# Patient Record
Sex: Male | Born: 2000
Health system: Southern US, Community
[De-identification: ages and names within clinical notes are randomized; demographics above are authoritative.]

## PROBLEM LIST (undated history)

## (undated) HISTORY — PX: TONSILLECTOMY AND ADENOIDECTOMY: SUR1326

## (undated) HISTORY — PX: ADENOIDECTOMY: SUR15

## (undated) HISTORY — PX: TYMPANOSTOMY TUBE PLACEMENT: SHX32

---
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
## 2019-03-08 ENCOUNTER — Telehealth: Payer: Self-pay

## 2019-03-08 ENCOUNTER — Other Ambulatory Visit: Payer: Self-pay

## 2019-03-08 ENCOUNTER — Encounter: Payer: Self-pay | Admitting: Emergency Medicine

## 2019-03-08 ENCOUNTER — Ambulatory Visit (INDEPENDENT_AMBULATORY_CARE_PROVIDER_SITE_OTHER): Payer: BC Managed Care – PPO | Admitting: Emergency Medicine

## 2019-03-08 VITALS — BP 112/68 | HR 66 | Temp 97.9°F | Ht 70.05 in | Wt 175.0 lb

## 2019-03-08 DIAGNOSIS — Z020 Encounter for examination for admission to educational institution: Secondary | ICD-10-CM

## 2019-03-08 DIAGNOSIS — Z23 Encounter for immunization: Secondary | ICD-10-CM

## 2019-03-08 DIAGNOSIS — Z0000 Encounter for general adult medical examination without abnormal findings: Secondary | ICD-10-CM | POA: Diagnosis not present

## 2019-03-08 NOTE — Telephone Encounter (Signed)
Copy made of school forms, put in for scan

## 2019-03-08 NOTE — Progress Notes (Signed)
Thomas Steele 17 y.o.   Chief Complaint  Patient presents with  . Annual Exam    no medical concerns today, Mom wants to talk about the hpv    HISTORY OF PRESENT ILLNESS: This is a 18 y.o. male here today for college physical and vaccines.  HPI   Prior to Admission medications   Medication Sig Start Date End Date Taking? Authorizing Provider  omeprazole (PRILOSEC) 20 MG capsule Take by mouth. 06/15/18  Yes [provider]    No Known Allergies  Patient Active Problem List   Diagnosis Date Noted  . Congenital hydrocele 03/26/2012    History reviewed. No pertinent past medical history.  Past Surgical History:  Procedure Laterality Date  . ADENOIDECTOMY    . INGUINAL HERNIA REPAIR Right 05/23/2016   Procedure: RIGHT INGUINAL HERNIA REPAIR PEDIATRIC;  Surgeon: Leonia CoronaShuaib Farooqui, MD;  Location: [facility];  Service: Pediatrics;  Laterality: Right;  . TONSILLECTOMY AND ADENOIDECTOMY    . TYMPANOSTOMY TUBE PLACEMENT      Social History   Socioeconomic History  . Marital status: Single    Spouse name: Not on file  . Number of children: Not on file  . Years of education: Not on file  . Highest education level: Not on file  Occupational History  . Occupation: student    Comment: [employer]  Social Needs  . Financial resource strain: Not on file  . Food insecurity    Worry: Not on file    Inability: Not on file  . Transportation needs    Medical: Not on file    Non-medical: Not on file  Tobacco Use  . Smoking status: Never Smoker  . Smokeless tobacco: Never Used  Substance and Sexual Activity  . Alcohol use: No    Alcohol/week: 0.0 standard drinks  . Drug use: No  . Sexual activity: Not on file  Lifestyle  . Physical activity    Days per week: Not on file    Minutes per session: Not on file  . Stress: Not on file  Relationships  . Social Musicianconnections    Talks on phone: Not on file    Gets together: Not on file    Attends  religious service: Not on file    Active member of club or organization: Not on file    Attends meetings of clubs or organizations: Not on file    Relationship status: Not on file  . Intimate partner violence    Fear of current or ex partner: Not on file    Emotionally abused: Not on file    Physically abused: Not on file    Forced sexual activity: Not on file  Other Topics Concern  . Not on file  Social History Narrative   Lives with both parents and sister.    History reviewed. No pertinent family history.   Review of Systems  Constitutional: Negative for chills and fever.  HENT: Negative.  Negative for sore throat.   Eyes: Negative.   Respiratory: Negative.  Negative for cough, hemoptysis, shortness of breath and wheezing.   Cardiovascular: Negative.  Negative for chest pain and palpitations.  Gastrointestinal: Negative.   Genitourinary: Negative.   Musculoskeletal: Negative.   Skin: Negative.  Negative for rash.  Neurological: Negative.  Negative for dizziness and headaches.  All other systems reviewed and are negative.   Vitals:   03/08/19 1351  BP: 112/68  Pulse: 66  Temp: 97.9 F (36.6 C)  SpO2: 99%  Physical Exam Vitals signs reviewed.  Constitutional:      Appearance: Normal appearance.  HENT:     Head: Normocephalic and atraumatic.     Nose: Nose normal.     Mouth/Throat:     Mouth: Mucous membranes are moist.     Pharynx: Oropharynx is clear.  Eyes:     Extraocular Movements: Extraocular movements intact.     Conjunctiva/sclera: Conjunctivae normal.     Pupils: Pupils are equal, round, and reactive to light.  Neck:     Musculoskeletal: Normal range of motion and neck supple.  Cardiovascular:     Rate and Rhythm: Normal rate and regular rhythm.     Pulses: Normal pulses.     Heart sounds: Normal heart sounds.  Pulmonary:     Effort: Pulmonary effort is normal.     Breath sounds: Normal breath sounds.  Musculoskeletal: Normal range of motion.   Skin:    General: Skin is warm and dry.  Neurological:     General: No focal deficit present.     Mental Status: He is alert and oriented to person, place, and time.  Psychiatric:        Mood and Affect: Mood normal.        Behavior: Behavior normal.      ASSESSMENT & PLAN: [name] was seen today for annual exam.  Diagnoses and all orders for this visit:  Encounter for physical examination of student  Need for meningococcal vaccination -     MENINGOCOCCAL MCV4O  Need for HPV vaccination -     HPV 9-valent vaccine,Recombinat     Patient Instructions       If you have lab work done today you will be contacted with your lab results within the next 2 weeks.  If you have not heard from us then please contact us. The fastest way to get your results is to register for My Chart.   IF you received an x-ray today, you will receive an invoice from [facility]. Please contact [facility] at [phone] with questions or concerns regarding your invoice.   IF you received labwork today, you will receive an invoice from [facility]. Please contact [facility] at [phone] with questions or concerns regarding your invoice.   Our billing staff will not be able to assist you with questions regarding bills from these companies.  You will be contacted with the lab results as soon as they are available. The fastest way to get your results is to activate your My Chart account. Instructions are located on the last page of this paperwork. If you have not heard from us regarding the results in 2 weeks, please contact this office.     Health Maintenance, Male A healthy lifestyle and preventive care is important for your health and wellness. Ask your health care provider about what schedule of regular examinations is right for you. What should I know about weight and diet? Eat a Healthy Diet  Eat plenty of vegetables, fruits, whole grains, low-fat dairy products, and lean  protein.  Do not eat a lot of foods high in solid fats, added sugars, or salt.  Maintain a Healthy Weight Regular exercise can help you achieve or maintain a healthy weight. You should:  Do at least 150 minutes of exercise each week. The exercise should increase your heart rate and make you sweat (moderate-intensity exercise).  Do strength-training exercises at least twice a week. Watch Your Levels of Cholesterol and Blood Lipids  Have your blood  tested for lipids and cholesterol every 5 years starting at 19 years of age. If you are at high risk for heart disease, you should start having your blood tested when you are 19 years old. You may need to have your cholesterol levels checked more often if: ? Your lipid or cholesterol levels are high. ? You are older than 19 years of age. ? You are at high risk for heart disease. What should I know about cancer screening? Many types of cancers can be detected early and may often be prevented. Lung Cancer  You should be screened every year for lung cancer if: ? You are a current smoker who has smoked for at least 30 years. ? You are a former smoker who has quit within the past 15 years.  Talk to your health care provider about your screening options, when you should start screening, and how often you should be screened. Colorectal Cancer  Routine colorectal cancer screening usually begins at 19 years of age and should be repeated every 5-10 years until you are 19 years old. You may need to be screened more often if early forms of precancerous polyps or small growths are found. Your health care provider may recommend screening at an earlier age if you have risk factors for colon cancer.  Your health care provider may recommend using home test kits to check for hidden blood in the stool.  A small camera at the end of a tube can be used to examine your colon (sigmoidoscopy or colonoscopy). This checks for the earliest forms of colorectal cancer.  Prostate and Testicular Cancer  Depending on your age and overall health, your health care provider may do certain tests to screen for prostate and testicular cancer.  Talk to your health care provider about any symptoms or concerns you have about testicular or prostate cancer. Skin Cancer  Check your skin from head to toe regularly.  Tell your health care provider about any new moles or changes in moles, especially if: ? There is a change in a mole's size, shape, or color. ? You have a mole that is larger than a pencil eraser.  Always use sunscreen. Apply sunscreen liberally and repeat throughout the day.  Protect yourself by wearing long sleeves, pants, a wide-brimmed hat, and sunglasses when outside. What should I know about heart disease, diabetes, and high blood pressure?  If you are 4518-19 years of age, have your blood pressure checked every 3-5 years. If you are 19 years of age or older, have your blood pressure checked every year. You should have your blood pressure measured twice-once when you are at a hospital or clinic, and once when you are not at a hospital or clinic. Record the average of the two measurements. To check your blood pressure when you are not at a hospital or clinic, you can use: ? An automated blood pressure machine at a pharmacy. ? A home blood pressure monitor.  Talk to your health care provider about your target blood pressure.  If you are between 2545-19 years old, ask your health care provider if you should take aspirin to prevent heart disease.  Have regular diabetes screenings by checking your fasting blood sugar level. ? If you are at a normal weight and have a low risk for diabetes, have this test once every three years after the age of 745. ? If you are overweight and have a high risk for diabetes, consider being tested at a younger age or more  often.  A one-time screening for abdominal aortic aneurysm (AAA) by ultrasound is recommended for men aged  13-75 years who are current or former smokers. What should I know about preventing infection? Hepatitis B If you have a higher risk for hepatitis B, you should be screened for this virus. Talk with your health care provider to find out if you are at risk for hepatitis B infection. Hepatitis C Blood testing is recommended for:  Everyone born from [birth year] through 1966.  Anyone with known risk factors for hepatitis C. Sexually Transmitted Diseases (STDs)  You should be screened each year for STDs including gonorrhea and chlamydia if: ? You are sexually active and are younger than 19 years of age. ? You are older than 19 years of age and your health care provider tells you that you are at risk for this type of infection. ? Your sexual activity has changed since you were last screened and you are at an increased risk for chlamydia or gonorrhea. Ask your health care provider if you are at risk.  Talk with your health care provider about whether you are at high risk of being infected with HIV. Your health care provider may recommend a prescription medicine to help prevent HIV infection. What else can I do?  Schedule regular health, dental, and eye exams.  Stay current with your vaccines (immunizations).  Do not use any tobacco products, such as cigarettes, chewing tobacco, and e-cigarettes. If you need help quitting, ask your health care provider.  Limit alcohol intake to no more than 2 drinks per day. One drink equals 12 ounces of beer, 5 ounces of wine, or 1 ounces of hard liquor.  Do not use street drugs.  Do not share needles.  Ask your health care provider for help if you need support or information about quitting drugs.  Tell your health care provider if you often feel depressed.  Tell your health care provider if you have ever been abused or do not feel safe at home. This information is not intended to replace advice given to you by your health care provider. Make sure you discuss  any questions you have with your health care provider. Document Released: 02/29/2008 Document Revised: 05/01/2016 Document Reviewed: 06/06/2015 Elsevier Interactive Patient Education  2019 Elsevier Inc.     Agustina Caroli, MD [facility]

## 2019-03-08 NOTE — Patient Instructions (Addendum)

## 2019-03-10 DIAGNOSIS — U071 COVID-19: Secondary | ICD-10-CM | POA: Diagnosis not present

## 2019-04-22 ENCOUNTER — Telehealth: Payer: Self-pay | Admitting: Emergency Medicine

## 2019-04-22 NOTE — Telephone Encounter (Signed)
Patient mother called and needs a copy of his physical that was done in June , he decided to go to the local college instead and they need a copy if it can be sent through his mychart  And emailed it would be great   [email or web address]

## 2019-04-25 ENCOUNTER — Encounter: Payer: Self-pay | Admitting: Emergency Medicine

## 2019-04-30 ENCOUNTER — Telehealth: Payer: Self-pay | Admitting: Emergency Medicine

## 2019-04-30 NOTE — Telephone Encounter (Signed)
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 So he needs the form filled out again. The paper is in the providers box at nurses station.

## 2019-05-01 NOTE — Telephone Encounter (Signed)
Thanks

## 2019-05-03 NOTE — Telephone Encounter (Signed)
New forms received and given to the provider to be completed

## 2019-05-03 NOTE — Telephone Encounter (Signed)
Please fax paperwork to [phone] to attention Rolena Infante (mom)

## 2019-05-07 NOTE — Telephone Encounter (Signed)
I have faxed the form on Monday and re-faxed today as well. I called the pt mom and informed her of this and she mom stated that she did get the message on Monday. I stated understanding and forms are placed in the scan bin.  Thanks, [employer]

## 2019-05-20 ENCOUNTER — Telehealth: Payer: BC Managed Care – PPO | Admitting: Emergency Medicine

## 2019-05-20 ENCOUNTER — Other Ambulatory Visit: Payer: Self-pay

## 2019-05-27 ENCOUNTER — Telehealth: Payer: BC Managed Care – PPO | Admitting: Emergency Medicine

## 2019-08-16 ENCOUNTER — Telehealth: Payer: BC Managed Care – PPO | Admitting: Emergency Medicine

## 2019-08-16 ENCOUNTER — Other Ambulatory Visit: Payer: Self-pay

## 2019-08-16 DIAGNOSIS — S134XXA Sprain of ligaments of cervical spine, initial encounter: Secondary | ICD-10-CM | POA: Diagnosis not present

## 2019-08-16 DIAGNOSIS — S73101A Unspecified sprain of right hip, initial encounter: Secondary | ICD-10-CM | POA: Diagnosis not present

## 2019-08-16 DIAGNOSIS — S233XXA Sprain of ligaments of thoracic spine, initial encounter: Secondary | ICD-10-CM | POA: Diagnosis not present

## 2019-08-16 DIAGNOSIS — S338XXA Sprain of other parts of lumbar spine and pelvis, initial encounter: Secondary | ICD-10-CM | POA: Diagnosis not present

## 2019-08-17 ENCOUNTER — Other Ambulatory Visit: Payer: Self-pay

## 2019-08-17 DIAGNOSIS — Z20822 Contact with and (suspected) exposure to covid-19: Secondary | ICD-10-CM

## 2019-08-19 ENCOUNTER — Ambulatory Visit: Payer: BC Managed Care – PPO | Admitting: Emergency Medicine

## 2019-08-20 LAB — NOVEL CORONAVIRUS, NAA: SARS-CoV-2, NAA: NOT DETECTED

## 2019-08-23 ENCOUNTER — Telehealth (INDEPENDENT_AMBULATORY_CARE_PROVIDER_SITE_OTHER): Payer: BC Managed Care – PPO | Admitting: Emergency Medicine

## 2019-08-23 ENCOUNTER — Encounter: Payer: Self-pay | Admitting: Emergency Medicine

## 2019-08-23 ENCOUNTER — Other Ambulatory Visit: Payer: Self-pay

## 2019-08-23 VITALS — Ht 70.0 in | Wt 182.0 lb

## 2019-08-23 DIAGNOSIS — J302 Other seasonal allergic rhinitis: Secondary | ICD-10-CM

## 2019-08-23 MED ORDER — MONTELUKAST SODIUM 10 MG PO TABS: 10.0000 mg | ORAL_TABLET | Freq: Every day | ORAL | 1 refills | Status: DC

## 2019-08-23 NOTE — Progress Notes (Signed)
Telemedicine Encounter- SOAP NOTE Established Patient  This telephone encounter was conducted with the patient's (or proxy's) verbal consent via audio telecommunications: yes/no: Yes Patient was instructed to have this encounter in a suitably private space; and to only have persons present to whom they give permission to participate. In addition, patient identity was confirmed by use of name plus two identifiers (DOB and address).  I discussed the limitations, risks, security and privacy concerns of performing an evaluation and management service by telephone and the availability of in person appointments. I also discussed with the patient that there may be a patient responsible charge related to this service. The patient expressed understanding and agreed to proceed.  I spent a total of TIME; 0 MIN TO 60 MIN: 15 minutes talking with the patient or their proxy.  Chief Complaint  Patient presents with  . Headache    x 4 weeks ago - on and off, last Wednesday COVID test and results negative  . Sore Throat    with random cough - dry, and body ache    Subjective   Thomas Steele is a 18 y.o. male established patient. Telephone visit today complaining of intermittent symptoms for the past several months.  Occasional headaches with random sweats, achiness, intermittent sore throat.  Has a history of chronic allergies but no evaluation by an allergist yet.  Has been taking Allegra with some success.  Symptoms come and go, seasonal.  Today feels much better than 3 days ago, almost asymptomatic.  Spoke to mother also who provided more useful information.  Thomas Steele has been struggling with allergies for the past couple years.  Tested negative for COVID-19 last week.  No other significant symptoms.  HPI   Patient Active Problem List   Diagnosis Date Noted  . Congenital hydrocele 03/26/2012    History reviewed. No pertinent past medical history.  Current Outpatient Medications  Medication Sig  Dispense Refill  . fexofenadine (ALLEGRA) 180 MG tablet Take 180 mg by mouth daily.    [name] omeprazole (PRILOSEC) 20 MG capsule Take by mouth.     No current facility-administered medications for this visit.     No Known Allergies  Social History   Socioeconomic History  . Marital status: Single    Spouse name: Not on file  . Number of children: Not on file  . Years of education: Not on file  . Highest education level: Not on file  Occupational History  . Occupation: student    Comment: [employer]  Social Needs  . Financial resource strain: Not on file  . Food insecurity    Worry: Not on file    Inability: Not on file  . Transportation needs    Medical: Not on file    Non-medical: Not on file  Tobacco Use  . Smoking status: Never Smoker  . Smokeless tobacco: Never Used  Substance and Sexual Activity  . Alcohol use: No    Alcohol/week: 0.0 standard drinks  . Drug use: No  . Sexual activity: Not on file  Lifestyle  . Physical activity    Days per week: Not on file    Minutes per session: Not on file  . Stress: Not on file  Relationships  . Social Musician on phone: Not on file    Gets together: Not on file    Attends religious service: Not on file    Active member of club or organization: Not on file  Attends meetings of clubs or organizations: Not on file    Relationship status: Not on file  . Intimate partner violence    Fear of current or ex partner: Not on file    Emotionally abused: Not on file    Physically abused: Not on file    Forced sexual activity: Not on file  Other Topics Concern  . Not on file  Social History Narrative   Lives with both parents and sister.    Review of Systems  Constitutional: Negative.  Negative for chills and fever.  HENT: Positive for congestion and sore throat.   Eyes: Negative.  Negative for blurred vision and double vision.  Respiratory: Negative for cough and shortness of breath.   Cardiovascular:  Negative.  Negative for chest pain and palpitations.  Gastrointestinal: Negative.  Negative for abdominal pain, diarrhea, nausea and vomiting.  Genitourinary: Negative.  Negative for dysuria.  Musculoskeletal: Positive for myalgias.  Skin: Negative.   Neurological: Positive for headaches.  All other systems reviewed and are negative.   Objective  Alert and oriented x3 in no apparent respiratory distress. Vitals as reported by the patient: Today's Vitals   08/23/19 1442  Weight: 182 lb (82.6 kg)  Height: 5\' 10"  (1.778 m)    There are no diagnoses linked to this encounter. [name] was seen today for headache and sore throat.  Diagnoses and all orders for this visit:  Seasonal allergies -     montelukast (SINGULAIR) 10 MG tablet; Take 1 tablet (10 mg total) by mouth at bedtime. -     Ambulatory referral to Allergy     I discussed the assessment and treatment plan with the patient. The patient was provided an opportunity to ask questions and all were answered. The patient agreed with the plan and demonstrated an understanding of the instructions.   The patient was advised to call back or seek an in-person evaluation if the symptoms worsen or if the condition fails to improve as anticipated.  I provided 15 minutes of non-face-to-face time during this encounter.  Horald Pollen, MD  Primary Care at [facility]

## 2019-08-30 DIAGNOSIS — S134XXA Sprain of ligaments of cervical spine, initial encounter: Secondary | ICD-10-CM | POA: Diagnosis not present

## 2019-08-30 DIAGNOSIS — S73101A Unspecified sprain of right hip, initial encounter: Secondary | ICD-10-CM | POA: Diagnosis not present

## 2019-08-30 DIAGNOSIS — S338XXA Sprain of other parts of lumbar spine and pelvis, initial encounter: Secondary | ICD-10-CM | POA: Diagnosis not present

## 2019-08-30 DIAGNOSIS — S233XXA Sprain of ligaments of thoracic spine, initial encounter: Secondary | ICD-10-CM | POA: Diagnosis not present

## 2019-09-02 ENCOUNTER — Ambulatory Visit: Payer: BC Managed Care – PPO | Admitting: Allergy

## 2019-09-06 DIAGNOSIS — S73101A Unspecified sprain of right hip, initial encounter: Secondary | ICD-10-CM | POA: Diagnosis not present

## 2019-09-06 DIAGNOSIS — S338XXA Sprain of other parts of lumbar spine and pelvis, initial encounter: Secondary | ICD-10-CM | POA: Diagnosis not present

## 2019-09-06 DIAGNOSIS — S233XXA Sprain of ligaments of thoracic spine, initial encounter: Secondary | ICD-10-CM | POA: Diagnosis not present

## 2019-09-06 DIAGNOSIS — S134XXA Sprain of ligaments of cervical spine, initial encounter: Secondary | ICD-10-CM | POA: Diagnosis not present

## 2019-09-30 ENCOUNTER — Ambulatory Visit: Payer: BC Managed Care – PPO | Admitting: Allergy

## 2019-10-14 ENCOUNTER — Ambulatory Visit: Payer: Self-pay | Admitting: Allergy

## 2020-06-10 ENCOUNTER — Other Ambulatory Visit: Payer: Self-pay | Admitting: Emergency Medicine

## 2020-06-10 DIAGNOSIS — J302 Other seasonal allergic rhinitis: Secondary | ICD-10-CM

## 2020-06-16 ENCOUNTER — Encounter: Payer: Self-pay | Admitting: Family Medicine

## 2020-06-16 ENCOUNTER — Ambulatory Visit (INDEPENDENT_AMBULATORY_CARE_PROVIDER_SITE_OTHER): Payer: BC Managed Care – PPO | Admitting: Family Medicine

## 2020-06-16 ENCOUNTER — Ambulatory Visit (INDEPENDENT_AMBULATORY_CARE_PROVIDER_SITE_OTHER): Payer: BC Managed Care – PPO

## 2020-06-16 ENCOUNTER — Other Ambulatory Visit: Payer: Self-pay

## 2020-06-16 VITALS — BP 138/74 | HR 64 | Temp 98.1°F | Ht 70.0 in | Wt 180.4 lb

## 2020-06-16 DIAGNOSIS — S67193A Crushing injury of left middle finger, initial encounter: Secondary | ICD-10-CM

## 2020-06-16 DIAGNOSIS — M79645 Pain in left finger(s): Secondary | ICD-10-CM

## 2020-06-16 DIAGNOSIS — M7989 Other specified soft tissue disorders: Secondary | ICD-10-CM | POA: Diagnosis not present

## 2020-06-16 DIAGNOSIS — T148XXA Other injury of unspecified body region, initial encounter: Secondary | ICD-10-CM

## 2020-06-16 NOTE — Patient Instructions (Signed)
° ° ° °  If you have lab work done today you will be contacted with your lab results within the next 2 weeks.  If you have not heard from us then please contact us. The fastest way to get your results is to register for My Chart. ° ° °IF you received an x-ray today, you will receive an invoice from [facility]. Please contact [facility] at [phone] with questions or concerns regarding your invoice.  ° °IF you received labwork today, you will receive an invoice from [facility]. Please contact [facility] at [phone] with questions or concerns regarding your invoice.  ° °Our billing staff will not be able to assist you with questions regarding bills from these companies. ° °You will be contacted with the lab results as soon as they are available. The fastest way to get your results is to activate your My Chart account. Instructions are located on the last page of this paperwork. If you have not heard from us regarding the results in 2 weeks, please contact this office. °  ° ° ° °

## 2020-06-16 NOTE — Progress Notes (Signed)
10/1/20219:53 AM  Thomas Steele July 23, 2001, 19 y.o., male [id number]  Chief Complaint  Patient presents with  . left middle finger injury    crushed by 2 weights on last friday 06/09/20    HPI:   Patient is a 19 y.o. male who presents today for left middle finger ingury  A week ago had crush injury with weights He self drained subungal hematoma the day after, since then throbbing pain resolved He however has had swelling and redness of nail bed No fever or chills No numbness or tingling He is able to move his finger fine He does not remember his last tetanus vaccine - declines  Depression screen [facility] 2/9 06/16/2020 08/23/2019 05/22/2016  Decreased Interest 0 0 0  Down, Depressed, Hopeless 0 0 0  PHQ - 2 Score 0 0 0    Fall Risk  06/16/2020 08/23/2019  Falls in the past year? 0 0  Number falls in past yr: 0 -  Injury with Fall? 0 -  Follow up Falls evaluation completed Falls evaluation completed     No Known Allergies  Prior to Admission medications   Medication Sig Start Date End Date Taking? Authorizing Provider  fexofenadine (ALLEGRA) 180 MG tablet Take 180 mg by mouth daily.   Yes [provider]  montelukast (SINGULAIR) 10 MG tablet TAKE 1 TABLET BY MOUTH EVERYDAY AT BEDTIME 06/10/20  Yes Sagardia, Eilleen Kempf, MD  omeprazole (PRILOSEC) 20 MG capsule Take by mouth. 06/15/18  Yes [provider]    No past medical history on file.  Past Surgical History:  Procedure Laterality Date  . ADENOIDECTOMY    . INGUINAL HERNIA REPAIR Right 05/23/2016   Procedure: RIGHT INGUINAL HERNIA REPAIR PEDIATRIC;  Surgeon: Leonia Corona, MD;  Location: [facility];  Service: Pediatrics;  Laterality: Right;  . TONSILLECTOMY AND ADENOIDECTOMY    . TYMPANOSTOMY TUBE PLACEMENT      Social History   Tobacco Use  . Smoking status: Never Smoker  . Smokeless tobacco: Never Used  Substance Use Topics  . Alcohol use: No    Alcohol/week: 0.0 standard  drinks    No family history on file.  ROS Per hpi  OBJECTIVE:  Today's Vitals   06/16/20 0945  BP: 138/74  Pulse: 64  Temp: 98.1 F (36.7 C)  TempSrc: Temporal  SpO2: 98%  Weight: 180 lb 6.4 oz (81.8 kg)  Height: 5\' 10"  (1.778 m)   Body mass index is 25.88 kg/m.   Physical Exam   Gen: AAOx3, NAD Left middle finger: distally swollen, subungal hematoma, mild erythema and swelling along intact cuticle. No warmth or tenderness, no drainage, finger pad not tender or swollen. NVI.  No results found for this or any previous visit (from the past 24 hour(s)).  DG Hand Complete Left  Result Date: 06/16/2020 CLINICAL DATA:  Crush injury to middle finger 1 week ago EXAM: LEFT HAND - COMPLETE 3+ VIEW COMPARISON:  None FINDINGS: Osseous mineralization normal. Joint spaces preserved. Soft tissue swelling at distal phalanx LEFT middle finger dorsally. No acute fracture, dislocation, or bone destruction. IMPRESSION: No acute osseous abnormalities. Electronically Signed   By: 08/16/2020 M.D.   On: 06/16/2020 10:09     ASSESSMENT and PLAN  1. Swollen finger No signs of infection. No fracture on xray. Patient declined draining and tetanus vaccine. Supportive measures and RTC precautions reviewed. - DG Hand Complete Left  2. Crush injury  No follow-ups on file.    08/16/2020  Pamella Pert, MD Primary Care at [facility] [place], [place] [address] Ph.  [phone] Fax [phone]

## 2020-06-22 DIAGNOSIS — T148XXA Other injury of unspecified body region, initial encounter: Secondary | ICD-10-CM | POA: Diagnosis not present

## 2020-08-08 ENCOUNTER — Other Ambulatory Visit: Payer: BC Managed Care – PPO

## 2020-08-08 ENCOUNTER — Other Ambulatory Visit: Payer: Self-pay

## 2020-08-08 DIAGNOSIS — Z20822 Contact with and (suspected) exposure to covid-19: Secondary | ICD-10-CM | POA: Diagnosis not present

## 2020-08-09 LAB — NOVEL CORONAVIRUS, NAA: SARS-CoV-2, NAA: NOT DETECTED

## 2020-08-09 LAB — SARS-COV-2, NAA 2 DAY TAT

## 2020-08-31 DIAGNOSIS — M25521 Pain in right elbow: Secondary | ICD-10-CM | POA: Diagnosis not present

## 2020-08-31 DIAGNOSIS — M25511 Pain in right shoulder: Secondary | ICD-10-CM | POA: Diagnosis not present

## 2020-09-27 DIAGNOSIS — Z20822 Contact with and (suspected) exposure to covid-19: Secondary | ICD-10-CM | POA: Diagnosis not present

## 2020-10-01 ENCOUNTER — Other Ambulatory Visit: Payer: Self-pay | Admitting: Emergency Medicine

## 2020-10-01 DIAGNOSIS — J302 Other seasonal allergic rhinitis: Secondary | ICD-10-CM

## 2021-01-07 ENCOUNTER — Other Ambulatory Visit: Payer: Self-pay | Admitting: Emergency Medicine

## 2021-01-07 DIAGNOSIS — J302 Other seasonal allergic rhinitis: Secondary | ICD-10-CM

## 2021-03-27 ENCOUNTER — Other Ambulatory Visit: Payer: Self-pay | Admitting: Emergency Medicine

## 2021-03-27 DIAGNOSIS — J302 Other seasonal allergic rhinitis: Secondary | ICD-10-CM

## 2021-05-09 DIAGNOSIS — Z130 Encounter for screening for diseases of the blood and blood-forming organs and certain disorders involving the immune mechanism: Secondary | ICD-10-CM | POA: Diagnosis not present

## 2021-07-23 ENCOUNTER — Other Ambulatory Visit: Payer: Self-pay | Admitting: Emergency Medicine

## 2021-07-23 DIAGNOSIS — J302 Other seasonal allergic rhinitis: Secondary | ICD-10-CM

## 2021-11-14 ENCOUNTER — Ambulatory Visit: Payer: BC Managed Care – PPO | Admitting: Emergency Medicine

## 2021-11-21 ENCOUNTER — Other Ambulatory Visit: Payer: Self-pay

## 2021-11-21 ENCOUNTER — Encounter: Payer: Self-pay | Admitting: Emergency Medicine

## 2021-11-21 ENCOUNTER — Ambulatory Visit: Payer: BC Managed Care – PPO | Admitting: Emergency Medicine

## 2021-11-21 VITALS — BP 118/70 | HR 56 | Ht 70.0 in | Wt 185.0 lb

## 2021-11-21 DIAGNOSIS — B349 Viral infection, unspecified: Secondary | ICD-10-CM | POA: Insufficient documentation

## 2021-11-21 DIAGNOSIS — G9331 Postviral fatigue syndrome: Secondary | ICD-10-CM | POA: Insufficient documentation

## 2021-11-21 DIAGNOSIS — J302 Other seasonal allergic rhinitis: Secondary | ICD-10-CM

## 2021-11-21 LAB — COMPREHENSIVE METABOLIC PANEL
ALT: 39 U/L (ref 0–53)
AST: 26 U/L (ref 0–37)
Albumin: 4.5 g/dL (ref 3.5–5.2)
Alkaline Phosphatase: 82 U/L (ref 39–117)
BUN: 28 mg/dL — ABNORMAL HIGH (ref 6–23)
CO2: 25 mEq/L (ref 19–32)
Calcium: 9.4 mg/dL (ref 8.4–10.5)
Chloride: 104 mEq/L (ref 96–112)
Creatinine, Ser: 1.05 mg/dL (ref 0.40–1.50)
GFR: 101.41 mL/min (ref >60.00)
Glucose, Bld: 68 mg/dL — ABNORMAL LOW (ref 70–99)
Potassium: 4.2 mEq/L (ref 3.5–5.1)
Sodium: 141 mEq/L (ref 135–145)
Total Bilirubin: 0.5 mg/dL (ref 0.2–1.2)
Total Protein: 6.8 g/dL (ref 6.0–8.3)

## 2021-11-21 LAB — CBC WITH DIFFERENTIAL/PLATELET
Basophils Absolute: 0 10*3/uL (ref 0.0–0.1)
Basophils Relative: 0.4 % (ref 0.0–3.0)
Eosinophils Absolute: 0.1 10*3/uL (ref 0.0–0.7)
Eosinophils Relative: 1.3 % (ref 0.0–5.0)
HCT: 46.7 % (ref 39.0–52.0)
Hemoglobin: 15.7 g/dL (ref 13.0–17.0)
Lymphocytes Relative: 36.5 % (ref 12.0–46.0)
Lymphs Abs: 2 10*3/uL (ref 0.7–4.0)
MCHC: 33.6 g/dL (ref 30.0–36.0)
MCV: 90.3 fl (ref 78.0–100.0)
Monocytes Absolute: 0.4 10*3/uL (ref 0.1–1.0)
Monocytes Relative: 7.6 % (ref 3.0–12.0)
Neutro Abs: 3 10*3/uL (ref 1.4–7.7)
Neutrophils Relative %: 54.2 % (ref 43.0–77.0)
Platelets: 223 10*3/uL (ref 150.0–400.0)
RBC: 5.17 Mil/uL (ref 4.22–5.81)
RDW: 12.6 % (ref 11.5–15.5)
WBC: 5.5 10*3/uL (ref 4.0–10.5)

## 2021-11-21 LAB — TSH: TSH: 0.56 u[IU]/mL (ref 0.35–5.50)

## 2021-11-21 LAB — HEMOGLOBIN A1C: Hgb A1c MFr Bld: 5.1 % (ref 4.6–6.5)

## 2021-11-21 MED ORDER — MONTELUKAST SODIUM 10 MG PO TABS: ORAL_TABLET | ORAL | 3 refills | Status: DC

## 2021-11-21 NOTE — Progress Notes (Signed)
Thomas Steele 21 y.o.   Chief Complaint  Patient presents with   Medication Refill    Singular, discuss fatigue    HISTORY OF PRESENT ILLNESS: Acute problem visit today. This is a 21 y.o. male complaining of fatigue for the past couple weeks. Electronics engineer.  Baseball player.  Teammates also sick with similar symptoms for the past 10 to 12 days with cough and flulike symptoms.  Patient and teammates tested negative for COVID. Has cough at times productive of phlegm.  Headaches and general malaise. No other associated symptoms. Has history of seasonal allergies.  Needs a refill on Singulair. No other complaints or medical concerns today.  Medication Refill Associated symptoms include coughing. Pertinent negatives include no abdominal pain, chest pain, chills, congestion, fever, headaches, nausea, rash, sore throat or vomiting.    Prior to Admission medications   Medication Sig Start Date End Date Taking? Authorizing Provider  fexofenadine (ALLEGRA) 180 MG tablet Take 180 mg by mouth daily.   Yes [provider]  montelukast (SINGULAIR) 10 MG tablet TAKE 1 TABLET BY MOUTH EVERYDAY AT BEDTIME 11/21/21   Horald Pollen, MD    No Known Allergies  Patient Active Problem List   Diagnosis Date Noted   Viral illness 11/21/2021   Postviral fatigue syndrome 11/21/2021   Congenital hydrocele 03/26/2012    No past medical history on file.  Past Surgical History:  Procedure Laterality Date   ADENOIDECTOMY     INGUINAL HERNIA REPAIR Right 05/23/2016   Procedure: RIGHT INGUINAL HERNIA REPAIR PEDIATRIC;  Surgeon: Gerald Stabs, MD;  Location: [facility];  Service: Pediatrics;  Laterality: Right;   TONSILLECTOMY AND ADENOIDECTOMY     TYMPANOSTOMY TUBE PLACEMENT      Social History   Socioeconomic History   Marital status: Single    Spouse name: Not on file   Number of children: Not on file   Years of education: Not on file   Highest education  level: Not on file  Occupational History   Occupation: student    Comment: [employer]  Tobacco Use   Smoking status: Never   Smokeless tobacco: Never  Substance and Sexual Activity   Alcohol use: No    Alcohol/week: 0.0 standard drinks   Drug use: No   Sexual activity: Not on file  Other Topics Concern   Not on file  Social History Narrative   Lives with both parents and sister.   Social Determinants of Health   Financial Resource Strain: Not on file  Food Insecurity: Not on file  Transportation Needs: Not on file  Physical Activity: Not on file  Stress: Not on file  Social Connections: Not on file  Intimate Partner Violence: Not on file    No family history on file.   Review of Systems  Constitutional:  Positive for malaise/fatigue. Negative for chills and fever.  HENT:  Negative for congestion and sore throat.   Respiratory:  Positive for cough. Negative for shortness of breath.   Cardiovascular: Negative.  Negative for chest pain and palpitations.  Gastrointestinal: Negative.  Negative for abdominal pain, diarrhea, nausea and vomiting.  Genitourinary: Negative.  Negative for dysuria and hematuria.  Skin: Negative.  Negative for rash.  Neurological:  Negative for dizziness and headaches.  All other systems reviewed and are negative.  Today's Vitals   11/21/21 0929  BP: 118/70  Pulse: (!) 56  SpO2: 97%  Weight: 185 lb (83.9 kg)  Height: '5\' 10"'  (1.778 m)  Body mass index is 26.54 kg/m.  Physical Exam Vitals reviewed.  Constitutional:      Appearance: Normal appearance.  HENT:     Head: Normocephalic.     Right Ear: Tympanic membrane, ear canal and external ear normal.     Left Ear: Tympanic membrane, ear canal and external ear normal.     Mouth/Throat:     Mouth: Mucous membranes are moist.     Pharynx: Oropharynx is clear.  Eyes:     Extraocular Movements: Extraocular movements intact.     Conjunctiva/sclera: Conjunctivae normal.     Pupils:  Pupils are equal, round, and reactive to light.  Cardiovascular:     Rate and Rhythm: Normal rate and regular rhythm.     Pulses: Normal pulses.     Heart sounds: Normal heart sounds.  Pulmonary:     Effort: Pulmonary effort is normal.     Breath sounds: Normal breath sounds.  Abdominal:     General: There is no distension.     Palpations: Abdomen is soft. There is no mass.     Tenderness: There is no abdominal tenderness.  Musculoskeletal:        General: Normal range of motion.     Right lower leg: No edema.     Left lower leg: No edema.  Skin:    General: Skin is warm and dry.     Capillary Refill: Capillary refill takes less than 2 seconds.  Neurological:     General: No focal deficit present.     Mental Status: He is alert and oriented to person, place, and time.  Psychiatric:        Mood and Affect: Mood normal.        Behavior: Behavior normal.     ASSESSMENT & PLAN: Problem List Items Addressed This Visit       Other   Viral illness    Stable and running its course.  No red flag signs or symptoms.  COVID-negative. Advised to rest and stay well-hydrated. Follow-up if no better or worse during the next couple of weeks.      Postviral fatigue syndrome - Primary    Fatigue secondary to viral syndrome.  Blood work done today to rule out other causes. Differential diagnosis of fatigue discussed with patient. Advised to rest, eat well and take in appropriate calories, stay well-hydrated.      Relevant Orders   CBC with Differential/Platelet   Comprehensive metabolic panel   TSH   Hemoglobin A1c   Mononucleosis Test, Qual W/ Reflex   Other Visit Diagnoses     Seasonal allergies  (Chronic)      Relevant Medications   montelukast (SINGULAIR) 10 MG tablet      Patient Instructions  Fatigue If you have fatigue, you feel tired all the time and have a lack of energy or a lack of motivation. Fatigue may make it difficult to start or complete tasks because of  exhaustion. In general, occasional or mild fatigue is often a normal response to activity or life. However, long-lasting (chronic) or extreme fatigue may be a symptom of a medical condition. Follow these instructions at home: General instructions Watch your fatigue for any changes. Go to bed and get up at the same time every day. Avoid fatigue by pacing yourself during the day and getting enough sleep at night. Maintain a healthy weight. Medicines Take over-the-counter and prescription medicines only as told by your health care provider. Take a multivitamin, if told  by your health care provider.  Do not use herbal or dietary supplements unless they are approved by your health care provider. Activity  Exercise regularly, as told by your health care provider. Use or practice techniques to help you relax, such as yoga, tai chi, meditation, or massage therapy. Eating and drinking  Avoid heavy meals in the evening. Eat a well-balanced diet, which includes lean proteins, whole grains, plenty of fruits and vegetables, and low-fat dairy products. Avoid consuming too much caffeine. Avoid the use of alcohol. Drink enough fluid to keep your urine pale yellow. Lifestyle Change situations that cause you stress. Try to keep your work and personal schedule in balance. Do not use any products that contain nicotine or tobacco, such as cigarettes and e-cigarettes. If you need help quitting, ask your health care provider. Do not use drugs. Contact a health care provider if: Your fatigue does not get better. You have a fever. You suddenly lose or gain weight. You have headaches. You have trouble falling asleep or sleeping through the night. You feel angry, guilty, anxious, or sad. You are unable to have a bowel movement (constipation). Your skin is dry. You have swelling in your legs or another part of your body. Get help right away if: You feel confused. Your vision is blurry. You feel faint or  you pass out. You have a severe headache. You have severe pain in your abdomen, your back, or the area between your waist and hips (pelvis). You have chest pain, shortness of breath, or an irregular or fast heartbeat. You are unable to urinate, or you urinate less than normal. You have abnormal bleeding, such as bleeding from the rectum, vagina, nose, lungs, or nipples. You vomit blood. You have thoughts about hurting yourself or others. If you ever feel like you may hurt yourself or others, or have thoughts about taking your own life, get help right away. You can go to your nearest emergency department or call: Your local emergency services (911 in the [place] A suicide crisis helpline, such as the [facility] at 972-693-2791 or 988 in the [place]. This is open 24 hours a day. Summary If you have fatigue, you feel tired all the time and have a lack of energy or a lack of motivation. Fatigue may make it difficult to start or complete tasks because of exhaustion. Long-lasting (chronic) or extreme fatigue may be a symptom of a medical condition. Exercise regularly, as told by your health care provider. Change situations that cause you stress. Try to keep your work and personal schedule in balance. This information is not intended to replace advice given to you by your health care provider. Make sure you discuss any questions you have with your health care provider. Document Revised: 03/28/2021 Document Reviewed: 07/13/2020 Elsevier Patient Education  2022 [place], MD [facility]

## 2021-11-21 NOTE — Assessment & Plan Note (Signed)
Fatigue secondary to viral syndrome.  Blood work done today to rule out other causes. ?Differential diagnosis of fatigue discussed with patient. ?Advised to rest, eat well and take in appropriate calories, stay well-hydrated. ?

## 2021-11-21 NOTE — Patient Instructions (Signed)
Fatigue ?If you have fatigue, you feel tired all the time and have a lack of energy or a lack of motivation. Fatigue may make it difficult to start or complete tasks because of exhaustion. In general, occasional or mild fatigue is often a normal response to activity or life. However, long-lasting (chronic) or extreme fatigue may be a symptom of a medical condition. ?Follow these instructions at home: ?General instructions ?Watch your fatigue for any changes. ?Go to bed and get up at the same time every day. ?Avoid fatigue by pacing yourself during the day and getting enough sleep at night. ?Maintain a healthy weight. ?Medicines ?Take over-the-counter and prescription medicines only as told by your health care provider. ?Take a multivitamin, if told by your health care provider.  ?Do not use herbal or dietary supplements unless they are approved by your health care provider. ?Activity ? ?Exercise regularly, as told by your health care provider. ?Use or practice techniques to help you relax, such as yoga, tai chi, meditation, or massage therapy. ?Eating and drinking ? ?Avoid heavy meals in the evening. ?Eat a well-balanced diet, which includes lean proteins, whole grains, plenty of fruits and vegetables, and low-fat dairy products. ?Avoid consuming too much caffeine. ?Avoid the use of alcohol. ?Drink enough fluid to keep your urine pale yellow. ?Lifestyle ?Change situations that cause you stress. Try to keep your work and personal schedule in balance. ?Do not use any products that contain nicotine or tobacco, such as cigarettes and e-cigarettes. If you need help quitting, ask your health care provider. ?Do not use drugs. ?Contact a health care provider if: ?Your fatigue does not get better. ?You have a fever. ?You suddenly lose or gain weight. ?You have headaches. ?You have trouble falling asleep or sleeping through the night. ?You feel angry, guilty, anxious, or sad. ?You are unable to have a bowel movement  (constipation). ?Your skin is dry. ?You have swelling in your legs or another part of your body. ?Get help right away if: ?You feel confused. ?Your vision is blurry. ?You feel faint or you pass out. ?You have a severe headache. ?You have severe pain in your abdomen, your back, or the area between your waist and hips (pelvis). ?You have chest pain, shortness of breath, or an irregular or fast heartbeat. ?You are unable to urinate, or you urinate less than normal. ?You have abnormal bleeding, such as bleeding from the rectum, vagina, nose, lungs, or nipples. ?You vomit blood. ?You have thoughts about hurting yourself or others. ?If you ever feel like you may hurt yourself or others, or have thoughts about taking your own life, get help right away. You can go to your nearest emergency department or call: ?Your local emergency services (911 in the [place] ?A suicide crisis helpline, such as the National Suicide Prevention Lifeline at 1-800-273-8255 or 988 in the [place] This is open 24 hours a day. ?Summary ?If you have fatigue, you feel tired all the time and have a lack of energy or a lack of motivation. ?Fatigue may make it difficult to start or complete tasks because of exhaustion. ?Long-lasting (chronic) or extreme fatigue may be a symptom of a medical condition. ?Exercise regularly, as told by your health care provider. ?Change situations that cause you stress. Try to keep your work and personal schedule in balance. ?This information is not intended to replace advice given to you by your health care provider. Make sure you discuss any questions you have with your health care provider. ?Document Revised:   03/28/2021 Document Reviewed: 07/13/2020 ?Elsevier Patient Education ? 2022 Elsevier Inc[employer] ? ?

## 2021-11-21 NOTE — Assessment & Plan Note (Signed)
Stable and running its course.  No red flag signs or symptoms.  COVID-negative. ?Advised to rest and stay well-hydrated. ?Follow-up if no better or worse during the next couple of weeks. ?

## 2021-11-22 LAB — MONO QUAL W/RFLX QN: Mono Qual W/Rflx Qn: NEGATIVE

## 2022-03-02 IMAGING — DX DG HAND COMPLETE 3+V*L*
3 series · 3 of 3 positions shown · non-contrast
Comparison: None

CLINICAL DATA: Crush injury to middle finger 1 week ago

EXAM:
LEFT HAND - COMPLETE 3+ VIEW

[hand pa]
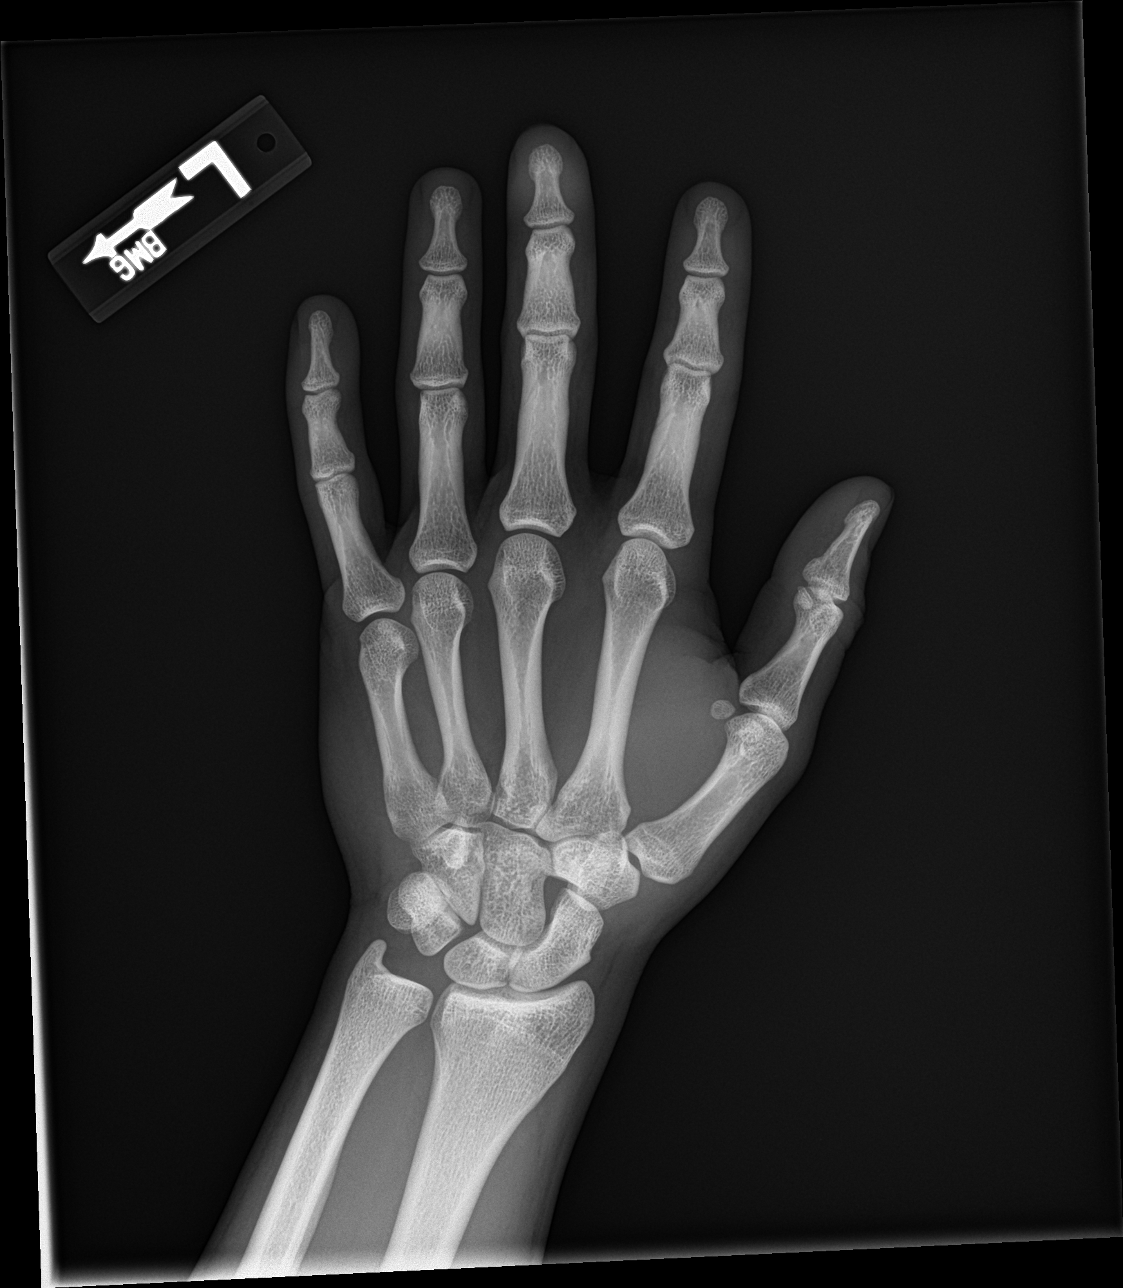

[hand obl]
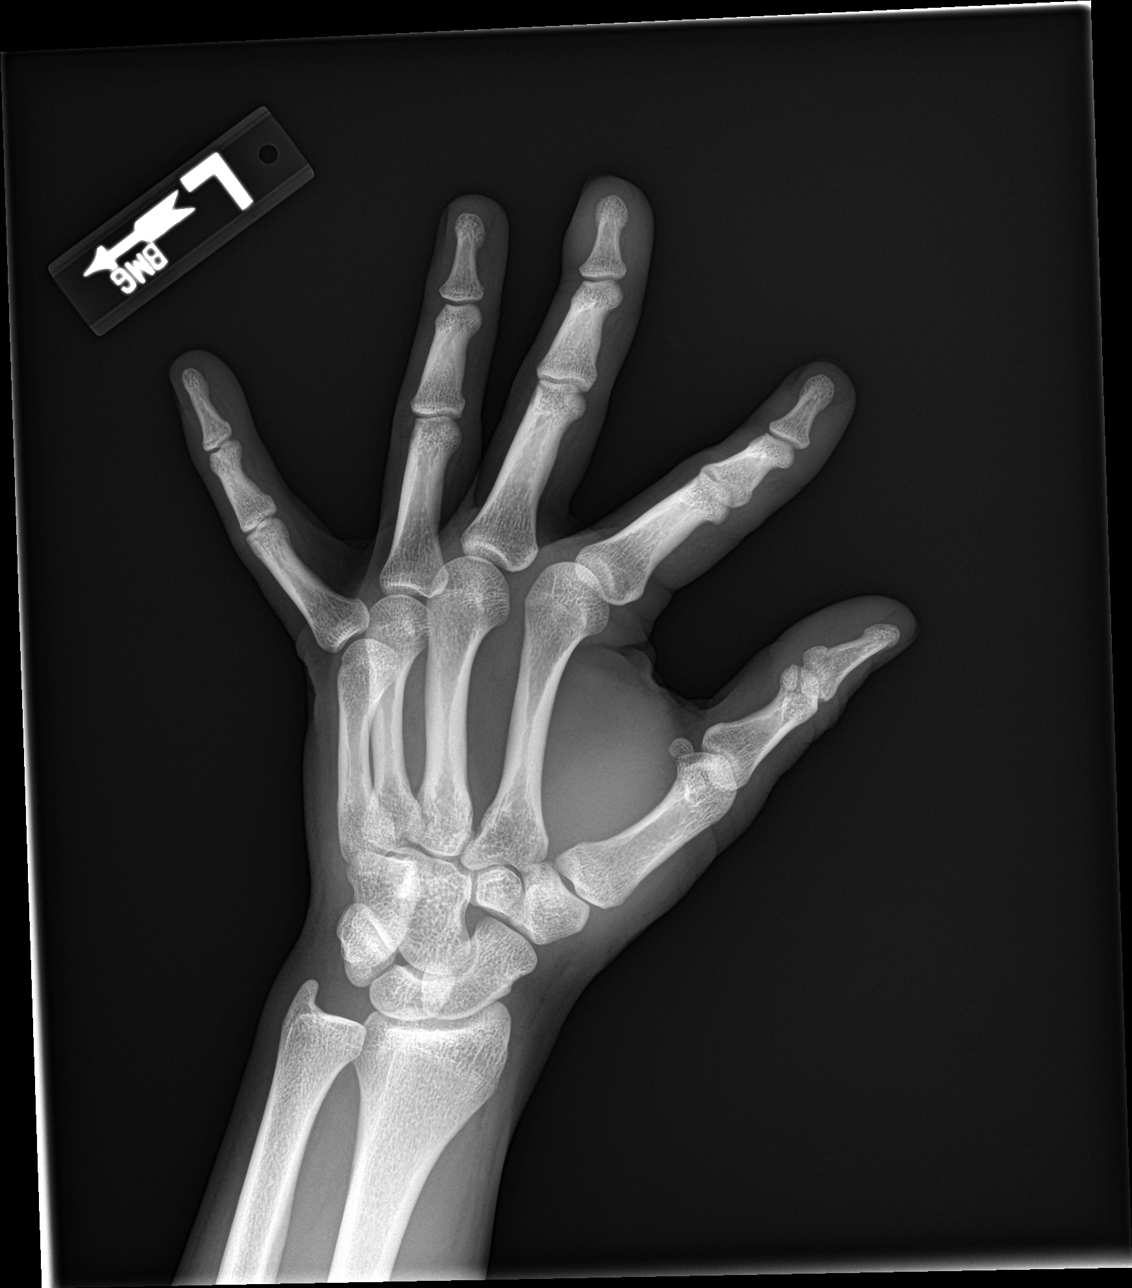

[hand lat]
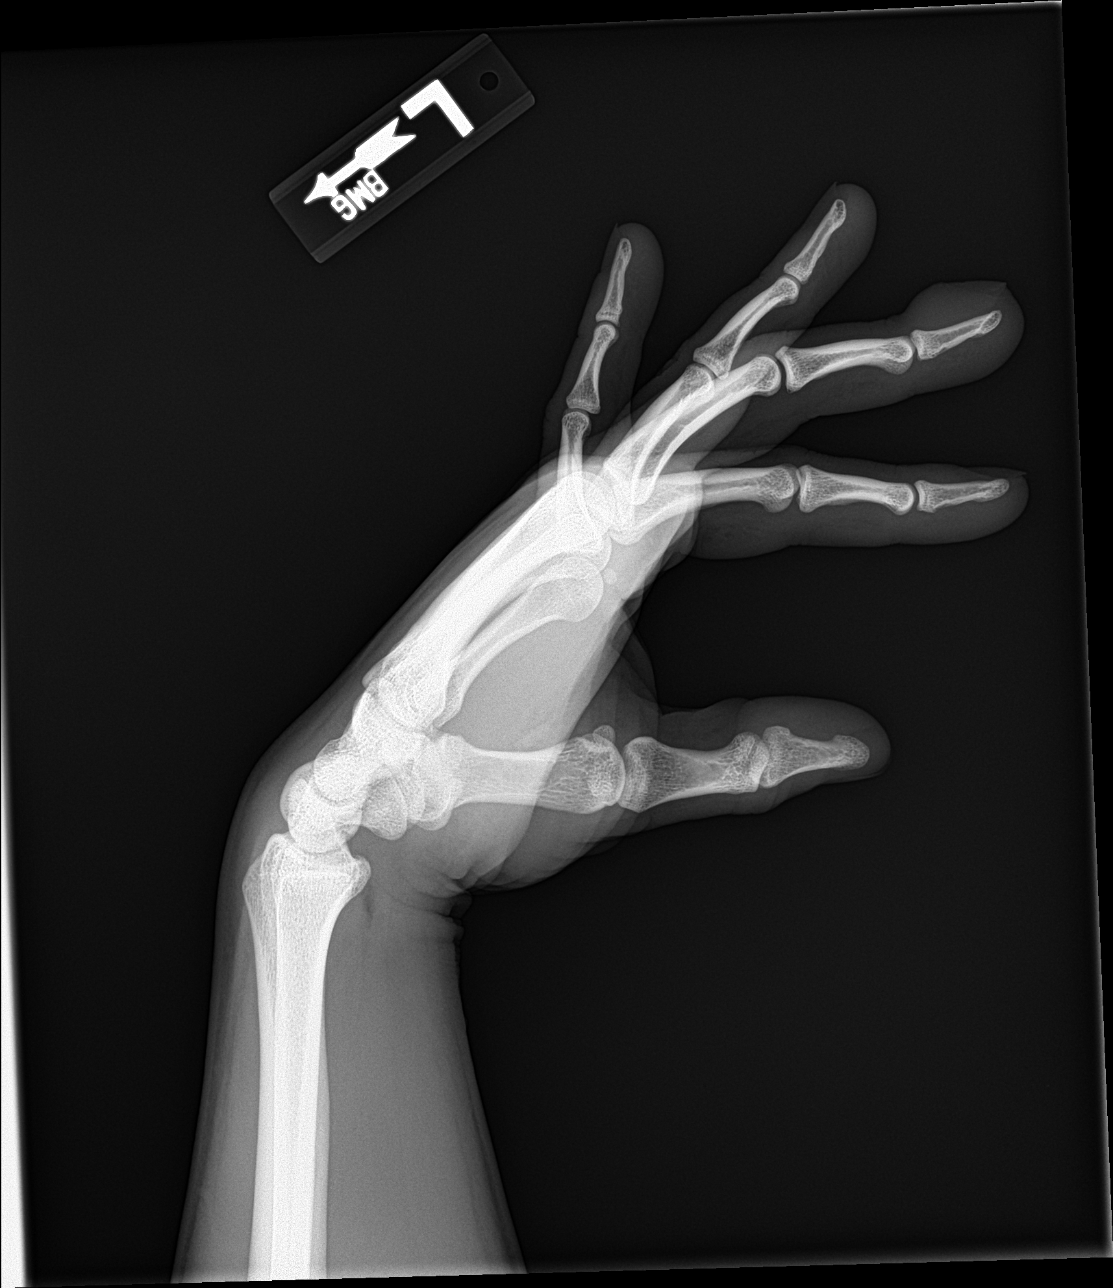

[3 of 3 positions shown; findings below may reference images not displayed]

FINDINGS: Osseous mineralization normal.

Joint spaces preserved.

Soft tissue swelling at distal phalanx LEFT middle finger dorsally.

No acute fracture, dislocation, or bone destruction.
IMPRESSION: No acute osseous abnormalities.

## 2022-11-22 ENCOUNTER — Other Ambulatory Visit: Payer: Self-pay | Admitting: Emergency Medicine

## 2022-11-22 DIAGNOSIS — J302 Other seasonal allergic rhinitis: Secondary | ICD-10-CM

## 2023-09-01 ENCOUNTER — Ambulatory Visit (INDEPENDENT_AMBULATORY_CARE_PROVIDER_SITE_OTHER): Payer: BC Managed Care – PPO | Admitting: Emergency Medicine

## 2023-09-01 ENCOUNTER — Encounter: Payer: Self-pay | Admitting: Emergency Medicine

## 2023-09-01 VITALS — BP 110/78 | HR 81 | Temp 98.3°F | Ht 70.0 in | Wt 185.0 lb

## 2023-09-01 DIAGNOSIS — Z0000 Encounter for general adult medical examination without abnormal findings: Secondary | ICD-10-CM

## 2023-09-01 DIAGNOSIS — Z130 Encounter for screening for diseases of the blood and blood-forming organs and certain disorders involving the immune mechanism: Secondary | ICD-10-CM

## 2023-09-01 DIAGNOSIS — Z1159 Encounter for screening for other viral diseases: Secondary | ICD-10-CM

## 2023-09-01 DIAGNOSIS — Z13220 Encounter for screening for lipoid disorders: Secondary | ICD-10-CM

## 2023-09-01 DIAGNOSIS — Z114 Encounter for screening for human immunodeficiency virus [HIV]: Secondary | ICD-10-CM

## 2023-09-01 NOTE — Progress Notes (Signed)
Thomas Steele 22 y.o.   Chief Complaint  Patient presents with   Annual Exam    Patient c/o chest/ sternum pain sometimes it "pops" that comes and goes. He states he thinks he pulled it in the gym. Digestives issues     HISTORY OF PRESENT ILLNESS: This is a 22 y.o. male A1A here for annual exam Healthy male with healthy lifestyle Occasional sternal pain when he works out Occasional bloating and increased gas with certain meals No other complaints or medical concerns today.  HPI   Prior to Admission medications   Medication Sig Start Date End Date Taking? Authorizing Provider  fexofenadine (ALLEGRA) 180 MG tablet Take 180 mg by mouth daily. Patient not taking: Reported on 09/01/2023    [provider]  montelukast (SINGULAIR) 10 MG tablet TAKE 1 TABLET BY MOUTH EVERYDAY AT BEDTIME Patient not taking: Reported on 09/01/2023 11/22/22   Georgina Quint, MD    No Known Allergies  Patient Active Problem List   Diagnosis Date Noted   Congenital hydrocele 03/26/2012    No past medical history on file.  Past Surgical History:  Procedure Laterality Date   ADENOIDECTOMY     INGUINAL HERNIA REPAIR Right 05/23/2016   Procedure: RIGHT INGUINAL HERNIA REPAIR PEDIATRIC;  Surgeon: Leonia Corona, MD;  Location: [facility];  Service: Pediatrics;  Laterality: Right;   TONSILLECTOMY AND ADENOIDECTOMY     TYMPANOSTOMY TUBE PLACEMENT      Social History   Socioeconomic History   Marital status: Single    Spouse name: Not on file   Number of children: Not on file   Years of education: Not on file   Highest education level: Not on file  Occupational History   Occupation: student    Comment: [employer]  Tobacco Use   Smoking status: Never   Smokeless tobacco: Never  Substance and Sexual Activity   Alcohol use: No    Alcohol/week: 0.0 standard drinks of alcohol   Drug use: No   Sexual activity: Not on file  Other Topics Concern   Not on file   Social History Narrative   Lives with both parents and sister.   Social Drivers of Corporate investment banker Strain: Not on file  Food Insecurity: Not on file  Transportation Needs: Not on file  Physical Activity: Not on file  Stress: Not on file  Social Connections: Not on file  Intimate Partner Violence: Not on file    No family history on file.   Review of Systems  Constitutional: Negative.  Negative for chills and fever.  HENT: Negative.  Negative for congestion and sore throat.   Respiratory: Negative.  Negative for cough and shortness of breath.   Cardiovascular: Negative.  Negative for chest pain and palpitations.  Gastrointestinal:  Negative for abdominal pain, diarrhea, nausea and vomiting.  Genitourinary: Negative.  Negative for dysuria and hematuria.  Skin: Negative.  Negative for rash.  Neurological: Negative.  Negative for dizziness and headaches.    Vitals:   09/01/23 1616  BP: 110/78  Pulse: 81  Temp: 98.3 F (36.8 C)  SpO2: 98%    Physical Exam Vitals reviewed.  Constitutional:      Appearance: Normal appearance.  HENT:     Head: Normocephalic.     Right Ear: Tympanic membrane, ear canal and external ear normal.     Left Ear: Tympanic membrane, ear canal and external ear normal.     Mouth/Throat:  Mouth: Mucous membranes are moist.     Pharynx: Oropharynx is clear.  Eyes:     Extraocular Movements: Extraocular movements intact.     Conjunctiva/sclera: Conjunctivae normal.     Pupils: Pupils are equal, round, and reactive to light.  Cardiovascular:     Rate and Rhythm: Normal rate and regular rhythm.     Pulses: Normal pulses.     Heart sounds: Normal heart sounds.  Pulmonary:     Effort: Pulmonary effort is normal.     Breath sounds: Normal breath sounds.  Abdominal:     Palpations: Abdomen is soft.     Tenderness: There is no abdominal tenderness.  Musculoskeletal:     Cervical back: No tenderness.  Lymphadenopathy:      Cervical: No cervical adenopathy.  Skin:    General: Skin is warm and dry.     Capillary Refill: Capillary refill takes less than 2 seconds.  Neurological:     General: No focal deficit present.     Mental Status: He is alert and oriented to person, place, and time.  Psychiatric:        Mood and Affect: Mood normal.        Behavior: Behavior normal.      ASSESSMENT & PLAN: Problem List Items Addressed This Visit   None Visit Diagnoses       Routine general medical examination at a health care facility    -  Primary   Relevant Orders   CBC with Differential   Comprehensive metabolic panel   Hemoglobin A1c   Lipid panel   Hepatitis C antibody screen   HIV antibody     Encounter for screening for HIV       Relevant Orders   HIV antibody     Need for hepatitis C screening test       Relevant Orders   Hepatitis C antibody screen     Screening for deficiency anemia       Relevant Orders   CBC with Differential     Screening for lipoid disorders       Relevant Orders   Lipid panel     Screening for endocrine, metabolic and immunity disorder       Relevant Orders   Comprehensive metabolic panel   Hemoglobin A1c      Modifiable risk factors discussed with patient. Anticipatory guidance according to age provided. The following topics were also discussed: Social Determinants of Health Smoking.  Non-smoker Diet and nutrition.  Exercises regularly Benefits of exercise.  Good eating habits Cancer family history review Vaccinations review and recommendations Cardiovascular risk assessment and need for blood work Mental health including depression and anxiety Fall and accident prevention  Patient Instructions  Health Maintenance, Male Adopting a healthy lifestyle and getting preventive care are important in promoting health and wellness. Ask your health care provider about: The right schedule for you to have regular tests and exams. Things you can do on your own to  prevent diseases and keep yourself healthy. What should I know about diet, weight, and exercise? Eat a healthy diet  Eat a diet that includes plenty of vegetables, fruits, low-fat dairy products, and lean protein. Do not eat a lot of foods that are high in solid fats, added sugars, or sodium. Maintain a healthy weight Body mass index (BMI) is a measurement that can be used to identify possible weight problems. It estimates body fat based on height and weight. Your health care provider can help  determine your BMI and help you achieve or maintain a healthy weight. Get regular exercise Get regular exercise. This is one of the most important things you can do for your health. Most adults should: Exercise for at least 150 minutes each week. The exercise should increase your heart rate and make you sweat (moderate-intensity exercise). Do strengthening exercises at least twice a week. This is in addition to the moderate-intensity exercise. Spend less time sitting. Even light physical activity can be beneficial. Watch cholesterol and blood lipids Have your blood tested for lipids and cholesterol at 23 years of age, then have this test every 5 years. You may need to have your cholesterol levels checked more often if: Your lipid or cholesterol levels are high. You are older than 23 years of age. You are at high risk for heart disease. What should I know about cancer screening? Many types of cancers can be detected early and may often be prevented. Depending on your health history and family history, you may need to have cancer screening at various ages. This may include screening for: Colorectal cancer. Prostate cancer. Skin cancer. Lung cancer. What should I know about heart disease, diabetes, and high blood pressure? Blood pressure and heart disease High blood pressure causes heart disease and increases the risk of stroke. This is more likely to develop in people who have high blood pressure  readings or are overweight. Talk with your health care provider about your target blood pressure readings. Have your blood pressure checked: Every 3-5 years if you are 23-64 years of age. Every year if you are 58 years old or older. If you are between the ages of 9 and 73 and are a current or former smoker, ask your health care provider if you should have a one-time screening for abdominal aortic aneurysm (AAA). Diabetes Have regular diabetes screenings. This checks your fasting blood sugar level. Have the screening done: Once every three years after age 56 if you are at a normal weight and have a low risk for diabetes. More often and at a younger age if you are overweight or have a high risk for diabetes. What should I know about preventing infection? Hepatitis B If you have a higher risk for hepatitis B, you should be screened for this virus. Talk with your health care provider to find out if you are at risk for hepatitis B infection. Hepatitis C Blood testing is recommended for: Everyone born from [birth year] through 1965. Anyone with known risk factors for hepatitis C. Sexually transmitted infections (STIs) You should be screened each year for STIs, including gonorrhea and chlamydia, if: You are sexually active and are younger than 23 years of age. You are older than 23 years of age and your health care provider tells you that you are at risk for this type of infection. Your sexual activity has changed since you were last screened, and you are at increased risk for chlamydia or gonorrhea. Ask your health care provider if you are at risk. Ask your health care provider about whether you are at high risk for HIV. Your health care provider may recommend a prescription medicine to help prevent HIV infection. If you choose to take medicine to prevent HIV, you should first get tested for HIV. You should then be tested every 3 months for as long as you are taking the medicine. Follow these instructions  at home: Alcohol use Do not drink alcohol if your health care provider tells you not to drink. If you  drink alcohol: Limit how much you have to 0-2 drinks a day. Know how much alcohol is in your drink. In the [place] one drink equals one 12 oz bottle of beer (355 mL), one 5 oz glass of wine (148 mL), or one 1 oz glass of hard liquor (44 mL). Lifestyle Do not use any products that contain nicotine or tobacco. These products include cigarettes, chewing tobacco, and vaping devices, such as e-cigarettes. If you need help quitting, ask your health care provider. Do not use street drugs. Do not share needles. Ask your health care provider for help if you need support or information about quitting drugs. General instructions Schedule regular health, dental, and eye exams. Stay current with your vaccines. Tell your health care provider if: You often feel depressed. You have ever been abused or do not feel safe at home. Summary Adopting a healthy lifestyle and getting preventive care are important in promoting health and wellness. Follow your health care provider's instructions about healthy diet, exercising, and getting tested or screened for diseases. Follow your health care provider's instructions on monitoring your cholesterol and blood pressure. This information is not intended to replace advice given to you by your health care provider. Make sure you discuss any questions you have with your health care provider. Document Revised: 01/22/2021 Document Reviewed: 01/22/2021 Elsevier Patient Education  2024 Elsevier Inc.     Edwina Barth, MD [facility]

## 2023-09-01 NOTE — Patient Instructions (Signed)
Health Maintenance, Male Adopting a healthy lifestyle and getting preventive care are important in promoting health and wellness. Ask your health care provider about: The right schedule for you to have regular tests and exams. Things you can do on your own to prevent diseases and keep yourself healthy. What should I know about diet, weight, and exercise? Eat a healthy diet  Eat a diet that includes plenty of vegetables, fruits, low-fat dairy products, and lean protein. Do not eat a lot of foods that are high in solid fats, added sugars, or sodium. Maintain a healthy weight Body mass index (BMI) is a measurement that can be used to identify possible weight problems. It estimates body fat based on height and weight. Your health care provider can help determine your BMI and help you achieve or maintain a healthy weight. Get regular exercise Get regular exercise. This is one of the most important things you can do for your health. Most adults should: Exercise for at least 150 minutes each week. The exercise should increase your heart rate and make you sweat (moderate-intensity exercise). Do strengthening exercises at least twice a week. This is in addition to the moderate-intensity exercise. Spend less time sitting. Even light physical activity can be beneficial. Watch cholesterol and blood lipids Have your blood tested for lipids and cholesterol at 23 years of age, then have this test every 5 years. You may need to have your cholesterol levels checked more often if: Your lipid or cholesterol levels are high. You are older than 23 years of age. You are at high risk for heart disease. What should I know about cancer screening? Many types of cancers can be detected early and may often be prevented. Depending on your health history and family history, you may need to have cancer screening at various ages. This may include screening for: Colorectal cancer. Prostate cancer. Skin cancer. Lung  cancer. What should I know about heart disease, diabetes, and high blood pressure? Blood pressure and heart disease High blood pressure causes heart disease and increases the risk of stroke. This is more likely to develop in people who have high blood pressure readings or are overweight. Talk with your health care provider about your target blood pressure readings. Have your blood pressure checked: Every 3-5 years if you are 23-39 years of age. Every year if you are 40 years old or older. If you are between the ages of 65 and 75 and are a current or former smoker, ask your health care provider if you should have a one-time screening for abdominal aortic aneurysm (AAA). Diabetes Have regular diabetes screenings. This checks your fasting blood sugar level. Have the screening done: Once every three years after age 23 if you are at a normal weight and have a low risk for diabetes. More often and at a younger age if you are overweight or have a high risk for diabetes. What should I know about preventing infection? Hepatitis B If you have a higher risk for hepatitis B, you should be screened for this virus. Talk with your health care provider to find out if you are at risk for hepatitis B infection. Hepatitis C Blood testing is recommended for: Everyone born from 1945 through 1965. Anyone with known risk factors for hepatitis C. Sexually transmitted infections (STIs) You should be screened each year for STIs, including gonorrhea and chlamydia, if: You are sexually active and are younger than 24 years of age. You are older than 24 years of age and your   health care provider tells you that you are at risk for this type of infection. Your sexual activity has changed since you were last screened, and you are at increased risk for chlamydia or gonorrhea. Ask your health care provider if you are at risk. Ask your health care provider about whether you are at high risk for HIV. Your health care provider  may recommend a prescription medicine to help prevent HIV infection. If you choose to take medicine to prevent HIV, you should first get tested for HIV. You should then be tested every 3 months for as long as you are taking the medicine. Follow these instructions at home: Alcohol use Do not drink alcohol if your health care provider tells you not to drink. If you drink alcohol: Limit how much you have to 0-2 drinks a day. Know how much alcohol is in your drink. In the [place] one drink equals one 12 oz bottle of beer (355 mL), one 5 oz glass of wine (148 mL), or one 1 oz glass of hard liquor (44 mL). Lifestyle Do not use any products that contain nicotine or tobacco. These products include cigarettes, chewing tobacco, and vaping devices, such as e-cigarettes. If you need help quitting, ask your health care provider. Do not use street drugs. Do not share needles. Ask your health care provider for help if you need support or information about quitting drugs. General instructions Schedule regular health, dental, and eye exams. Stay current with your vaccines. Tell your health care provider if: You often feel depressed. You have ever been abused or do not feel safe at home. Summary Adopting a healthy lifestyle and getting preventive care are important in promoting health and wellness. Follow your health care provider's instructions about healthy diet, exercising, and getting tested or screened for diseases. Follow your health care provider's instructions on monitoring your cholesterol and blood pressure. This information is not intended to replace advice given to you by your health care provider. Make sure you discuss any questions you have with your health care provider. Document Revised: 01/22/2021 Document Reviewed: 01/22/2021 Elsevier Patient Education  2024 Elsevier Inc.  

## 2023-09-15 DIAGNOSIS — R509 Fever, unspecified: Secondary | ICD-10-CM | POA: Diagnosis not present

## 2023-09-15 DIAGNOSIS — B349 Viral infection, unspecified: Secondary | ICD-10-CM | POA: Diagnosis not present

## 2023-09-22 ENCOUNTER — Ambulatory Visit: Payer: Self-pay | Admitting: Emergency Medicine

## 2023-09-22 NOTE — Telephone Encounter (Signed)
  Chief Complaint: Fever Symptoms: Fever, body aches, chills, cough Frequency: constant Pertinent Negatives: Patient denies neck stiffness Disposition: [] ED /[] Urgent Care (no appt availability in office) / [x] Appointment(In office/virtual)/ []  [facility]/ [] Home Care/ [] Refused Recommended Disposition /[] [facility]/ []  Follow-up with PCP Additional Notes: Patient and mother called in stating patient has been running a fever for a week. Patient was seen at [facility] about a week ago but not getting better. Patient has newly developed a cough. Patient states he is soaking the sheets at night with a fever.    Copied from CRM [id number]. Topic: Clinical - Red Word Triage >> Sep 22, 2023  4:02 PM Robinson DEL wrote: Red Word that prompted transfer to Nurse Triage: Mom states patient went to urgent care last Tuesday for fever, aching, temperature and headache. Patient is still having same issues along with a cough and a temperature of 100.7. Patient was tested for COVID, Strep and Flu and all were negative, was told to take ibuprofen and tylenol  alternating. Reason for Disposition  Fever present > 3 days (72 hours)  Answer Assessment - Initial Assessment Questions 1. TEMPERATURE: What is the most recent temperature?  How was it measured?      100.7 about 30 minutes ago 2. ONSET: When did the fever start?      Last Sunday 3. CHILLS: Do you have chills? If yes: How bad are they?  (e.g., none, mild, moderate, severe)   - NONE: no chills   - MILD: feeling cold   - MODERATE: feeling very cold, some shivering (feels better under a thick blanket)   - SEVERE: feeling extremely cold with shaking chills (general body shaking, rigors; even under a thick blanket)      Mild sometimes 4. OTHER SYMPTOMS: Do you have any other symptoms besides the fever?  (e.g., abdomen pain, cough, diarrhea, earache, headache, sore throat, urination pain)     Body ache, headache, new cough since last  [facility] appt 5. CAUSE: If there are no symptoms, ask: What do you think is causing the fever?      No, was negative for covid, strep, and flu 6. CONTACTS: Does anyone else in the family have an infection?     No 7. TREATMENT: What have you done so far to treat this fever? (e.g., medications)     Tylenol  for fever 8. IMMUNOCOMPROMISE: Do you have of the following: diabetes, HIV positive, splenectomy, cancer chemotherapy, chronic steroid treatment, transplant patient, etc.     No  Protocols used: Fever-A-AH

## 2023-09-23 ENCOUNTER — Ambulatory Visit: Payer: BC Managed Care – PPO | Admitting: Internal Medicine

## 2023-09-24 ENCOUNTER — Ambulatory Visit: Payer: Self-pay | Admitting: Emergency Medicine

## 2023-09-24 ENCOUNTER — Ambulatory Visit: Payer: Self-pay

## 2023-09-24 DIAGNOSIS — R509 Fever, unspecified: Secondary | ICD-10-CM | POA: Diagnosis not present

## 2023-09-24 DIAGNOSIS — R030 Elevated blood-pressure reading, without diagnosis of hypertension: Secondary | ICD-10-CM | POA: Diagnosis not present

## 2023-09-24 DIAGNOSIS — J22 Unspecified acute lower respiratory infection: Secondary | ICD-10-CM | POA: Diagnosis not present

## 2023-09-24 DIAGNOSIS — R059 Cough, unspecified: Secondary | ICD-10-CM | POA: Diagnosis not present

## 2023-09-24 NOTE — Telephone Encounter (Signed)
 New Phone number:[phone]   Chief Complaint: fever and cold s/s Symptoms: fever highest 102, cough, congestion,headache Frequency: x 10 days Pertinent Negatives: Patient denies SOB or chest pain Disposition: [] ED /[x] Urgent Care (no appt availability in office) / [] Appointment(In office/virtual)/ []  [facility]/ [] Home Care/ [] Refused Recommended Disposition /[] [facility]/ []  Follow-up with PCP Additional Notes: urgent care appt made for today.  Pt been taking fever reducing meds w/o relief  Reason for Disposition  Fever present > 3 days (72 hours)  Answer Assessment - Initial Assessment Questions 1. TEMPERATURE: What is the most recent temperature?  How was it measured?      Highest 102 2. ONSET: When did the fever start?      10 days 3. CHILLS: Do you have chills? If yes: How bad are they?  (e.g., none, mild, moderate, severe)   - NONE: no chills   - MILD: feeling cold   - MODERATE: feeling very cold, some shivering (feels better under a thick blanket)   - SEVERE: feeling extremely cold with shaking chills (general body shaking, rigors; even under a thick blanket)       4. OTHER SYMPTOMS: Do you have any other symptoms besides the fever?  (e.g., abdomen pain, cough, diarrhea, earache, headache, sore throat, urination pain)     Cough, congestion, headache 5. CAUSE: If there are no symptoms, ask: What do you think is causing the fever?     Cold symptoms 6. CONTACTS: Does anyone else in the family have an infection?     N/a 7. TREATMENT: What have you done so far to treat this fever? (e.g., medications)     Tylenol  and ibuprofen 8. IMMUNOCOMPROMISE: Do you have of the following: diabetes, HIV positive, splenectomy, cancer chemotherapy, chronic steroid treatment, transplant patient, etc.     no 9. PREGNANCY: Is there any chance you are pregnant? When was your last menstrual period?     N/a 10. TRAVEL: Have you traveled out of the  country in the last month? (e.g., travel history, exposures)       N/a  Protocols used: [facility]

## 2024-02-23 ENCOUNTER — Other Ambulatory Visit: Payer: Self-pay | Admitting: Emergency Medicine

## 2024-02-23 DIAGNOSIS — J302 Other seasonal allergic rhinitis: Secondary | ICD-10-CM

## 2024-07-02 ENCOUNTER — Other Ambulatory Visit

## 2024-07-05 ENCOUNTER — Ambulatory Visit: Admitting: Emergency Medicine

## 2024-07-05 ENCOUNTER — Encounter: Payer: Self-pay | Admitting: Emergency Medicine

## 2024-07-05 VITALS — BP 118/82 | HR 71 | Temp 97.9°F | Ht 70.0 in | Wt 178.0 lb

## 2024-07-05 DIAGNOSIS — Z1159 Encounter for screening for other viral diseases: Secondary | ICD-10-CM | POA: Diagnosis not present

## 2024-07-05 DIAGNOSIS — Z114 Encounter for screening for human immunodeficiency virus [HIV]: Secondary | ICD-10-CM | POA: Diagnosis not present

## 2024-07-05 DIAGNOSIS — R4189 Other symptoms and signs involving cognitive functions and awareness: Secondary | ICD-10-CM | POA: Diagnosis not present

## 2024-07-05 DIAGNOSIS — R5383 Other fatigue: Secondary | ICD-10-CM | POA: Diagnosis not present

## 2024-07-05 DIAGNOSIS — Z0000 Encounter for general adult medical examination without abnormal findings: Secondary | ICD-10-CM | POA: Diagnosis not present

## 2024-07-05 LAB — LIPID PANEL
Cholesterol: 182 mg/dL (ref 0–200)
HDL: 52.7 mg/dL (ref >39.00)
LDL Cholesterol: 107 mg/dL — ABNORMAL HIGH (ref 0–99)
NonHDL: 129.71
Total CHOL/HDL Ratio: 3
Triglycerides: 114 mg/dL (ref 0.0–149.0)
VLDL: 22.8 mg/dL (ref 0.0–40.0)

## 2024-07-05 LAB — COMPREHENSIVE METABOLIC PANEL WITH GFR
ALT: 54 U/L — ABNORMAL HIGH (ref 0–53)
AST: 25 U/L (ref 0–37)
Albumin: 5.1 g/dL (ref 3.5–5.2)
Alkaline Phosphatase: 58 U/L (ref 39–117)
BUN: 20 mg/dL (ref 6–23)
CO2: 29 meq/L (ref 19–32)
Calcium: 9.6 mg/dL (ref 8.4–10.5)
Chloride: 102 meq/L (ref 96–112)
Creatinine, Ser: 0.92 mg/dL (ref 0.40–1.50)
GFR: 116.68 mL/min (ref >60.00)
Glucose, Bld: 82 mg/dL (ref 70–99)
Potassium: 3.7 meq/L (ref 3.5–5.1)
Sodium: 140 meq/L (ref 135–145)
Total Bilirubin: 0.6 mg/dL (ref 0.2–1.2)
Total Protein: 7.2 g/dL (ref 6.0–8.3)

## 2024-07-05 LAB — HEMOGLOBIN A1C: Hgb A1c MFr Bld: 5.1 % (ref 4.6–6.5)

## 2024-07-05 LAB — CBC WITH DIFFERENTIAL/PLATELET
Basophils Absolute: 0 K/uL (ref 0.0–0.1)
Basophils Relative: 0.3 % (ref 0.0–3.0)
Eosinophils Absolute: 0 K/uL (ref 0.0–0.7)
Eosinophils Relative: 0.6 % (ref 0.0–5.0)
HCT: 45.5 % (ref 39.0–52.0)
Hemoglobin: 15.1 g/dL (ref 13.0–17.0)
Lymphocytes Relative: 34.4 % (ref 12.0–46.0)
Lymphs Abs: 2.5 K/uL (ref 0.7–4.0)
MCHC: 33.2 g/dL (ref 30.0–36.0)
MCV: 89.6 fl (ref 78.0–100.0)
Monocytes Absolute: 0.5 K/uL (ref 0.1–1.0)
Monocytes Relative: 6.4 % (ref 3.0–12.0)
Neutro Abs: 4.2 K/uL (ref 1.4–7.7)
Neutrophils Relative %: 58.3 % (ref 43.0–77.0)
Platelets: 192 K/uL (ref 150.0–400.0)
RBC: 5.08 Mil/uL (ref 4.22–5.81)
RDW: 12.5 % (ref 11.5–15.5)
WBC: 7.2 K/uL (ref 4.0–10.5)

## 2024-07-05 NOTE — Patient Instructions (Signed)
 Health Maintenance, Male  Adopting a healthy lifestyle and getting preventive care are important in promoting health and wellness. Ask your health care provider about:  The right schedule for you to have regular tests and exams.  Things you can do on your own to prevent diseases and keep yourself healthy.  What should I know about diet, weight, and exercise?  Eat a healthy diet    Eat a diet that includes plenty of vegetables, fruits, low-fat dairy products, and lean protein.  Do not eat a lot of foods that are high in solid fats, added sugars, or sodium.  Maintain a healthy weight  Body mass index (BMI) is a measurement that can be used to identify possible weight problems. It estimates body fat based on height and weight. Your health care provider can help determine your BMI and help you achieve or maintain a healthy weight.  Get regular exercise  Get regular exercise. This is one of the most important things you can do for your health. Most adults should:  Exercise for at least 150 minutes each week. The exercise should increase your heart rate and make you sweat (moderate-intensity exercise).  Do strengthening exercises at least twice a week. This is in addition to the moderate-intensity exercise.  Spend less time sitting. Even light physical activity can be beneficial.  Watch cholesterol and blood lipids  Have your blood tested for lipids and cholesterol at 23 years of age, then have this test every 5 years.  You may need to have your cholesterol levels checked more often if:  Your lipid or cholesterol levels are high.  You are older than 24 years of age.  You are at high risk for heart disease.  What should I know about cancer screening?  Many types of cancers can be detected early and may often be prevented. Depending on your health history and family history, you may need to have cancer screening at various ages. This may include screening for:  Colorectal cancer.  Prostate cancer.  Skin cancer.  Lung  cancer.  What should I know about heart disease, diabetes, and high blood pressure?  Blood pressure and heart disease  High blood pressure causes heart disease and increases the risk of stroke. This is more likely to develop in people who have high blood pressure readings or are overweight.  Talk with your health care provider about your target blood pressure readings.  Have your blood pressure checked:  Every 3-5 years if you are 24-52 years of age.  Every year if you are 3 years old or older.  If you are between the ages of 60 and 72 and are a current or former smoker, ask your health care provider if you should have a one-time screening for abdominal aortic aneurysm (AAA).  Diabetes  Have regular diabetes screenings. This checks your fasting blood sugar level. Have the screening done:  Once every three years after age 66 if you are at a normal weight and have a low risk for diabetes.  More often and at a younger age if you are overweight or have a high risk for diabetes.  What should I know about preventing infection?  Hepatitis B  If you have a higher risk for hepatitis B, you should be screened for this virus. Talk with your health care provider to find out if you are at risk for hepatitis B infection.  Hepatitis C  Blood testing is recommended for:  Everyone born from [birth year] through 1966.  Anyone  with known risk factors for hepatitis C.  Sexually transmitted infections (STIs)  You should be screened each year for STIs, including gonorrhea and chlamydia, if:  You are sexually active and are younger than 24 years of age.  You are older than 24 years of age and your health care provider tells you that you are at risk for this type of infection.  Your sexual activity has changed since you were last screened, and you are at increased risk for chlamydia or gonorrhea. Ask your health care provider if you are at risk.  Ask your health care provider about whether you are at high risk for HIV. Your health care provider  may recommend a prescription medicine to help prevent HIV infection. If you choose to take medicine to prevent HIV, you should first get tested for HIV. You should then be tested every 3 months for as long as you are taking the medicine.  Follow these instructions at home:  Alcohol use  Do not drink alcohol if your health care provider tells you not to drink.  If you drink alcohol:  Limit how much you have to 0-2 drinks a day.  Know how much alcohol is in your drink. In the [place] one drink equals one 12 oz bottle of beer (355 mL), one 5 oz glass of wine (148 mL), or one 1 oz glass of hard liquor (44 mL).  Lifestyle  Do not use any products that contain nicotine or tobacco. These products include cigarettes, chewing tobacco, and vaping devices, such as e-cigarettes. If you need help quitting, ask your health care provider.  Do not use street drugs.  Do not share needles.  Ask your health care provider for help if you need support or information about quitting drugs.  General instructions  Schedule regular health, dental, and eye exams.  Stay current with your vaccines.  Tell your health care provider if:  You often feel depressed.  You have ever been abused or do not feel safe at home.  Summary  Adopting a healthy lifestyle and getting preventive care are important in promoting health and wellness.  Follow your health care provider's instructions about healthy diet, exercising, and getting tested or screened for diseases.  Follow your health care provider's instructions on monitoring your cholesterol and blood pressure.  This information is not intended to replace advice given to you by your health care provider. Make sure you discuss any questions you have with your health care provider.  Document Revised: 01/22/2021 Document Reviewed: 01/22/2021  Elsevier Patient Education  2024 [employer].

## 2024-07-05 NOTE — Assessment & Plan Note (Signed)
 Chronic fatigue of unknown origin No recent lifestyle changes Healthy lifestyle No environmental exposure to toxic chemicals Works indoors Recommend blood work today Keeps well-hydrated Non-smoker and no alcohol drinker

## 2024-07-05 NOTE — Progress Notes (Signed)
 Thomas Steele 23 y.o.   Chief Complaint  Patient presents with   Fatigue    States he thinks his cognitive function is off. States a lot of fatigue. Has been going for a couple months, fatigue has been lingering for a couple of years.     HISTORY OF PRESENT ILLNESS: This is a 23 y.o. male complaining of cognitive function been off for the past couple of months No recent lifestyle changes Non-smoker.  No EtOH use No chronic medical conditions Fatigue on and off for couple years No other associated symptoms  HPI   Prior to Admission medications   Medication Sig Start Date End Date Taking? Authorizing Provider  montelukast  (SINGULAIR ) 10 MG tablet TAKE 1 TABLET BY MOUTH EVERYDAY AT BEDTIME 02/23/24  Yes Larisha Vencill, Emil Schanz, MD  fexofenadine (ALLEGRA) 180 MG tablet Take 180 mg by mouth daily. Patient not taking: Reported on 09/01/2023    [provider]    No Known Allergies  Patient Active Problem List   Diagnosis Date Noted   Congenital hydrocele 03/26/2012    No past medical history on file.  Past Surgical History:  Procedure Laterality Date   ADENOIDECTOMY     INGUINAL HERNIA REPAIR Right 05/23/2016   Procedure: RIGHT INGUINAL HERNIA REPAIR PEDIATRIC;  Surgeon: Julietta Millman, MD;  Location: [facility];  Service: Pediatrics;  Laterality: Right;   TONSILLECTOMY AND ADENOIDECTOMY     TYMPANOSTOMY TUBE PLACEMENT      Social History   Socioeconomic History   Marital status: Single    Spouse name: Not on file   Number of children: Not on file   Years of education: Not on file   Highest education level: Not on file  Occupational History   Occupation: student    Comment: [employer]  Tobacco Use   Smoking status: Never   Smokeless tobacco: Never  Substance and Sexual Activity   Alcohol use: No    Alcohol/week: 0.0 standard drinks of alcohol   Drug use: No   Sexual activity: Not on file  Other Topics Concern   Not on file  Social  History Narrative   Lives with both parents and sister.   Social Drivers of Corporate investment banker Strain: Not on file  Food Insecurity: Not on file  Transportation Needs: Not on file  Physical Activity: Not on file  Stress: Not on file  Social Connections: Not on file  Intimate Partner Violence: Not on file    No family history on file.   Review of Systems  Constitutional:  Positive for malaise/fatigue. Negative for chills, fever and weight loss.  HENT: Negative.  Negative for congestion and sore throat.   Respiratory: Negative.  Negative for cough and shortness of breath.   Cardiovascular: Negative.  Negative for chest pain and palpitations.  Gastrointestinal:  Negative for abdominal pain, diarrhea, nausea and vomiting.  Genitourinary: Negative.  Negative for dysuria and hematuria.  Musculoskeletal: Negative.   Skin: Negative.  Negative for rash.  Neurological: Negative.  Negative for dizziness and headaches.  All other systems reviewed and are negative.   Vitals:   07/05/24 1302  BP: 118/82  Pulse: 71  Temp: 97.9 F (36.6 C)  SpO2: 100%    Physical Exam Vitals reviewed.  Constitutional:      Appearance: Normal appearance.  HENT:     Head: Normocephalic.     Right Ear: Tympanic membrane, ear canal and external ear normal.     Left Ear:  Tympanic membrane, ear canal and external ear normal.     Mouth/Throat:     Mouth: Mucous membranes are moist.     Pharynx: Oropharynx is clear.  Eyes:     Extraocular Movements: Extraocular movements intact.     Pupils: Pupils are equal, round, and reactive to light.  Cardiovascular:     Rate and Rhythm: Normal rate and regular rhythm.     Pulses: Normal pulses.     Heart sounds: Normal heart sounds.  Pulmonary:     Effort: Pulmonary effort is normal.     Breath sounds: Normal breath sounds.  Abdominal:     Palpations: Abdomen is soft.     Tenderness: There is no abdominal tenderness.  Musculoskeletal:      Cervical back: No tenderness.  Lymphadenopathy:     Cervical: No cervical adenopathy.  Skin:    General: Skin is warm and dry.     Capillary Refill: Capillary refill takes less than 2 seconds.  Neurological:     General: No focal deficit present.     Mental Status: He is alert and oriented to person, place, and time.  Psychiatric:        Mood and Affect: Mood normal.        Behavior: Behavior normal.      ASSESSMENT & PLAN: A total of 42 minutes was spent with the patient and counseling/coordination of care regarding preparing for this visit, review of most recent office visit notes, differential diagnosis of decreased cognitive abilities and need for workup including neurology evaluation, review of all medications, review of most recent bloodwork results, review of health maintenance items, education on nutrition, prognosis, documentation, and need for follow up.   Problem List Items Addressed This Visit       Other   Brain fog - Primary   Feels his cognitive abilities are off for the past couple of months Unknown triggers Clinically stable.  No red flag signs or symptoms Recommend workup including neurology evaluation Referral placed today Blood work and urinalysis done today      Relevant Orders   Vitamin B12   VITAMIN D 25 Hydroxy (Vit-D Deficiency, Fractures)   TSH   Ferritin   Sedimentation rate   Zinc   Magnesium   Urinalysis   Ambulatory referral to Neurology   Other fatigue   Chronic fatigue of unknown origin No recent lifestyle changes Healthy lifestyle No environmental exposure to toxic chemicals Works indoors Recommend blood work today Keeps well-hydrated Non-smoker and no alcohol drinker      Relevant Orders   Vitamin B12   VITAMIN D 25 Hydroxy (Vit-D Deficiency, Fractures)   TSH   Ferritin   Sedimentation rate   Zinc   Magnesium   Urinalysis   Patient Instructions  Health Maintenance, Male Adopting a healthy lifestyle and getting  preventive care are important in promoting health and wellness. Ask your health care provider about: The right schedule for you to have regular tests and exams. Things you can do on your own to prevent diseases and keep yourself healthy. What should I know about diet, weight, and exercise? Eat a healthy diet  Eat a diet that includes plenty of vegetables, fruits, low-fat dairy products, and lean protein. Do not eat a lot of foods that are high in solid fats, added sugars, or sodium. Maintain a healthy weight Body mass index (BMI) is a measurement that can be used to identify possible weight problems. It estimates body fat based on height  and weight. Your health care provider can help determine your BMI and help you achieve or maintain a healthy weight. Get regular exercise Get regular exercise. This is one of the most important things you can do for your health. Most adults should: Exercise for at least 150 minutes each week. The exercise should increase your heart rate and make you sweat (moderate-intensity exercise). Do strengthening exercises at least twice a week. This is in addition to the moderate-intensity exercise. Spend less time sitting. Even light physical activity can be beneficial. Watch cholesterol and blood lipids Have your blood tested for lipids and cholesterol at 24 years of age, then have this test every 5 years. You may need to have your cholesterol levels checked more often if: Your lipid or cholesterol levels are high. You are older than 24 years of age. You are at high risk for heart disease. What should I know about cancer screening? Many types of cancers can be detected early and may often be prevented. Depending on your health history and family history, you may need to have cancer screening at various ages. This may include screening for: Colorectal cancer. Prostate cancer. Skin cancer. Lung cancer. What should I know about heart disease, diabetes, and high blood  pressure? Blood pressure and heart disease High blood pressure causes heart disease and increases the risk of stroke. This is more likely to develop in people who have high blood pressure readings or are overweight. Talk with your health care provider about your target blood pressure readings. Have your blood pressure checked: Every 3-5 years if you are 62-34 years of age. Every year if you are 22 years old or older. If you are between the ages of 58 and 70 and are a current or former smoker, ask your health care provider if you should have a one-time screening for abdominal aortic aneurysm (AAA). Diabetes Have regular diabetes screenings. This checks your fasting blood sugar level. Have the screening done: Once every three years after age 51 if you are at a normal weight and have a low risk for diabetes. More often and at a younger age if you are overweight or have a high risk for diabetes. What should I know about preventing infection? Hepatitis B If you have a higher risk for hepatitis B, you should be screened for this virus. Talk with your health care provider to find out if you are at risk for hepatitis B infection. Hepatitis C Blood testing is recommended for: Everyone born from [birth year] through 1966. Anyone with known risk factors for hepatitis C. Sexually transmitted infections (STIs) You should be screened each year for STIs, including gonorrhea and chlamydia, if: You are sexually active and are younger than 24 years of age. You are older than 24 years of age and your health care provider tells you that you are at risk for this type of infection. Your sexual activity has changed since you were last screened, and you are at increased risk for chlamydia or gonorrhea. Ask your health care provider if you are at risk. Ask your health care provider about whether you are at high risk for HIV. Your health care provider may recommend a prescription medicine to help prevent HIV infection. If you  choose to take medicine to prevent HIV, you should first get tested for HIV. You should then be tested every 3 months for as long as you are taking the medicine. Follow these instructions at home: Alcohol use Do not drink alcohol if your health care  provider tells you not to drink. If you drink alcohol: Limit how much you have to 0-2 drinks a day. Know how much alcohol is in your drink. In the [place] one drink equals one 12 oz bottle of beer (355 mL), one 5 oz glass of wine (148 mL), or one 1 oz glass of hard liquor (44 mL). Lifestyle Do not use any products that contain nicotine or tobacco. These products include cigarettes, chewing tobacco, and vaping devices, such as e-cigarettes. If you need help quitting, ask your health care provider. Do not use street drugs. Do not share needles. Ask your health care provider for help if you need support or information about quitting drugs. General instructions Schedule regular health, dental, and eye exams. Stay current with your vaccines. Tell your health care provider if: You often feel depressed. You have ever been abused or do not feel safe at home. Summary Adopting a healthy lifestyle and getting preventive care are important in promoting health and wellness. Follow your health care provider's instructions about healthy diet, exercising, and getting tested or screened for diseases. Follow your health care provider's instructions on monitoring your cholesterol and blood pressure. This information is not intended to replace advice given to you by your health care provider. Make sure you discuss any questions you have with your health care provider. Document Revised: 01/22/2021 Document Reviewed: 01/22/2021 Elsevier Patient Education  2024 Elsevier Inc.    Emil Schaumann, MD [facility]

## 2024-07-05 NOTE — Assessment & Plan Note (Signed)
 Feels his cognitive abilities are off for the past couple of months Unknown triggers Clinically stable.  No red flag signs or symptoms Recommend workup including neurology evaluation Referral placed today Blood work and urinalysis done today

## 2024-07-06 LAB — HEPATITIS C ANTIBODY: Hepatitis C Ab: NONREACTIVE

## 2024-07-06 LAB — HIV ANTIBODY (ROUTINE TESTING W REFLEX)
HIV 1&2 Ab, 4th Generation: NONREACTIVE
HIV FINAL INTERPRETATION: NEGATIVE

## 2024-07-14 ENCOUNTER — Telehealth: Payer: Self-pay

## 2024-07-14 NOTE — Telephone Encounter (Unsigned)
 Copied from CRM [id number]. Topic: Clinical - Lab/Test Results >> Jul 14, 2024  3:23 PM Dedra B wrote: Reason for CRM: Pt calling regarding lab results. Would like an explanation of results and recommendations to improve abnormal results. Pt said it's okay to respond via MyChart.

## 2024-07-15 ENCOUNTER — Telehealth: Payer: Self-pay

## 2024-07-15 NOTE — Telephone Encounter (Signed)
 Copied from CRM [id number]. Topic: Referral - Status >> Jul 14, 2024  3:27 PM Dedra B wrote: Reason for CRM: Pt calling to get update on neurology referral. Pls call pt or msg via MyChart.

## 2024-09-01 ENCOUNTER — Encounter: Admitting: Emergency Medicine
